# Patient Record
Sex: Male | Born: 1960 | Race: White | Hispanic: No | Marital: Married | Smoking: Never smoker
Health system: Southern US, Community
[De-identification: ages and names within clinical notes are randomized; demographics above are authoritative.]

## PROBLEM LIST (undated history)

## (undated) DIAGNOSIS — I509 Heart failure, unspecified: Secondary | ICD-10-CM

## (undated) HISTORY — PX: PACEMAKER INSERTION: SHX728

---

## 2011-12-15 DIAGNOSIS — E7800 Pure hypercholesterolemia, unspecified: Secondary | ICD-10-CM | POA: Insufficient documentation

## 2013-05-05 DIAGNOSIS — I429 Cardiomyopathy, unspecified: Secondary | ICD-10-CM | POA: Insufficient documentation

## 2013-05-05 DIAGNOSIS — Q231 Congenital insufficiency of aortic valve: Secondary | ICD-10-CM | POA: Insufficient documentation

## 2013-05-05 DIAGNOSIS — I442 Atrioventricular block, complete: Secondary | ICD-10-CM | POA: Insufficient documentation

## 2013-05-27 DIAGNOSIS — K4090 Unilateral inguinal hernia, without obstruction or gangrene, not specified as recurrent: Secondary | ICD-10-CM | POA: Insufficient documentation

## 2013-05-27 DIAGNOSIS — A422 Cervicofacial actinomycosis: Secondary | ICD-10-CM | POA: Insufficient documentation

## 2013-07-10 DIAGNOSIS — I341 Nonrheumatic mitral (valve) prolapse: Secondary | ICD-10-CM | POA: Insufficient documentation

## 2013-07-10 DIAGNOSIS — I517 Cardiomegaly: Secondary | ICD-10-CM | POA: Insufficient documentation

## 2018-12-13 DIAGNOSIS — Z952 Presence of prosthetic heart valve: Secondary | ICD-10-CM | POA: Insufficient documentation

## 2018-12-21 DIAGNOSIS — I5020 Unspecified systolic (congestive) heart failure: Secondary | ICD-10-CM | POA: Insufficient documentation

## 2019-02-26 ENCOUNTER — Other Ambulatory Visit: Payer: Self-pay

## 2019-02-26 ENCOUNTER — Encounter: Payer: PRIVATE HEALTH INSURANCE | Attending: Internal Medicine | Admitting: *Deleted

## 2019-02-26 DIAGNOSIS — Z952 Presence of prosthetic heart valve: Secondary | ICD-10-CM

## 2019-02-26 NOTE — Progress Notes (Signed)
Virtual initial orientation completed. Diagnosis can be found in CE 8/26. EP/RD orientation scheduled for 9/10 at 9:30

## 2019-02-27 ENCOUNTER — Encounter: Payer: PRIVATE HEALTH INSURANCE | Admitting: *Deleted

## 2019-02-27 VITALS — Ht 71.2 in | Wt 202.1 lb

## 2019-02-27 DIAGNOSIS — Z952 Presence of prosthetic heart valve: Secondary | ICD-10-CM | POA: Diagnosis present

## 2019-02-27 NOTE — Progress Notes (Signed)
Cardiac Individual Treatment Plan  Patient Details  Name: Brandon Kennedy MRN: [id number] Date of Birth: 01-27-1961 Referring Provider:     Cardiac Rehab from 02/27/2019 in [facility] Cardiac and Pulmonary Rehab  Referring Provider  Marisa Severin MD      Initial Encounter Date:    Cardiac Rehab from 02/27/2019 in [facility] Cardiac and Pulmonary Rehab  Date  02/27/19      Visit Diagnosis: S/P TAVR (transcatheter aortic valve replacement)  Patient's Home Medications on Admission:  Current Outpatient Medications:  .  acetaminophen (TYLENOL) 500 MG tablet, Take by mouth., Disp: , Rfl:  .  aspirin EC 81 MG tablet, Take by mouth., Disp: , Rfl:  .  losartan (COZAAR) 25 MG tablet, Take by mouth., Disp: , Rfl:  .  metoprolol succinate (TOPROL-XL) 25 MG 24 hr tablet, Take by mouth., Disp: , Rfl:   Past Medical History: No past medical history on file.  Tobacco Use: Social History   Tobacco Use  Smoking Status Never Smoker  Smokeless Tobacco Never Used    Labs: Recent Review Flowsheet Data    There is no flowsheet data to display.       Exercise Target Goals: Exercise Program Goal: Individual exercise prescription set using results from initial 6 min walk test and THRR while considering  patient's activity barriers and safety.   Exercise Prescription Goal: Initial exercise prescription builds to 30-45 minutes a day of aerobic activity, 2-3 days per week.  Home exercise guidelines will be given to patient during program as part of exercise prescription that the participant will acknowledge.  Activity Barriers & Risk Stratification: Activity Barriers & Cardiac Risk Stratification - 02/27/19 1054      Activity Barriers & Cardiac Risk Stratification   Activity Barriers  Arthritis;Joint Problems;Decreased Ventricular Function;Muscular Weakness   bilateral knee pain   Cardiac Risk Stratification  Low       6 Minute Walk: 6 Minute Walk    Row Name 02/27/19 1052         6 Minute  Walk   Phase  Initial     Distance  1806 feet     Walk Time  6 minutes     # of Rest Breaks  0     MPH  3.42     METS  5.13     RPE  14     VO2 Peak  17.97     Symptoms  No     Resting HR  85 bpm     Resting BP  142/86     Resting Oxygen Saturation   98 %     Exercise Oxygen Saturation  during 6 min walk  99 %     Max Ex. HR  128 bpm     Max Ex. BP  184/74 recheck BP prior to resistance training 164/72     2 Minute Post BP  142/74        Oxygen Initial Assessment:   Oxygen Re-Evaluation:   Oxygen Discharge (Final Oxygen Re-Evaluation):   Initial Exercise Prescription: Initial Exercise Prescription - 02/27/19 1000      Date of Initial Exercise RX and Referring Provider   Date  02/27/19    Referring Provider  Marisa Severin MD      Treadmill   MPH  3.3    Grade  1    Minutes  15    METs  3.71      REL-XR   Level  3  Speed  50    Minutes  15    METs  3.5      T5 Nustep   Level  4    SPM  80    Minutes  15    METs  3.5      Prescription Details   Frequency (times per week)  2    Duration  Progress to 30 minutes of continuous aerobic without signs/symptoms of physical distress      Intensity   THRR 40-80% of Max Heartrate  115-145    Ratings of Perceived Exertion  11-13    Perceived Dyspnea  0-4      Progression   Progression  Continue to progress workloads to maintain intensity without signs/symptoms of physical distress.      Resistance Training   Training Prescription  Yes    Weight  4 lbs    Reps  10-15       Perform Capillary Blood Glucose checks as needed.  Exercise Prescription Changes: Exercise Prescription Changes    Row Name 02/27/19 1100             Response to Exercise   Blood Pressure (Admit)  142/86       Blood Pressure (Exercise)  184/74 rck 164/72       Blood Pressure (Exit)  142/74       Heart Rate (Admit)  85 bpm       Heart Rate (Exercise)  128 bpm       Heart Rate (Exit)  92 bpm       Oxygen Saturation (Admit)   98 %       Oxygen Saturation (Exercise)  99 %       Rating of Perceived Exertion (Exercise)  14       Symptoms  none       Comments  walk test results          Exercise Comments:   Exercise Goals and Review: Exercise Goals    Row Name 02/27/19 1109             Exercise Goals   Increase Physical Activity  Yes       Intervention  Provide advice, education, support and counseling about physical activity/exercise needs.;Develop an individualized exercise prescription for aerobic and resistive training based on initial evaluation findings, risk stratification, comorbidities and participant's personal goals.       Expected Outcomes  Short Term: Attend rehab on a regular basis to increase amount of physical activity.;Long Term: Add in home exercise to make exercise part of routine and to increase amount of physical activity.;Long Term: Exercising regularly at least 3-5 days a week.       Increase Strength and Stamina  Yes       Intervention  Provide advice, education, support and counseling about physical activity/exercise needs.;Develop an individualized exercise prescription for aerobic and resistive training based on initial evaluation findings, risk stratification, comorbidities and participant's personal goals.       Expected Outcomes  Short Term: Increase workloads from initial exercise prescription for resistance, speed, and METs.;Short Term: Perform resistance training exercises routinely during rehab and add in resistance training at home;Long Term: Improve cardiorespiratory fitness, muscular endurance and strength as measured by increased METs and functional capacity (6MWT)       Able to understand and use rate of perceived exertion (RPE) scale  Yes       Intervention  Provide education and explanation on how to use RPE  scale       Expected Outcomes  Short Term: Able to use RPE daily in rehab to express subjective intensity level;Long Term:  Able to use RPE to guide intensity level  when exercising independently       Knowledge and understanding of Target Heart Rate Range (THRR)  Yes       Intervention  Provide education and explanation of THRR including how the numbers were predicted and where they are located for reference       Expected Outcomes  Short Term: Able to state/look up THRR;Short Term: Able to use daily as guideline for intensity in rehab;Long Term: Able to use THRR to govern intensity when exercising independently       Able to check pulse independently  Yes       Intervention  Provide education and demonstration on how to check pulse in carotid and radial arteries.;Review the importance of being able to check your own pulse for safety during independent exercise       Expected Outcomes  Short Term: Able to explain why pulse checking is important during independent exercise;Long Term: Able to check pulse independently and accurately       Understanding of Exercise Prescription  Yes       Intervention  Provide education, explanation, and written materials on patient's individual exercise prescription       Expected Outcomes  Long Term: Able to explain home exercise prescription to exercise independently;Short Term: Able to explain program exercise prescription          Exercise Goals Re-Evaluation :   Discharge Exercise Prescription (Final Exercise Prescription Changes): Exercise Prescription Changes - 02/27/19 1100      Response to Exercise   Blood Pressure (Admit)  142/86    Blood Pressure (Exercise)  184/74   rck 164/72   Blood Pressure (Exit)  142/74    Heart Rate (Admit)  85 bpm    Heart Rate (Exercise)  128 bpm    Heart Rate (Exit)  92 bpm    Oxygen Saturation (Admit)  98 %    Oxygen Saturation (Exercise)  99 %    Rating of Perceived Exertion (Exercise)  14    Symptoms  none    Comments  walk test results       Nutrition:  Target Goals: Understanding of nutrition guidelines, daily intake of sodium '1500mg'$ , cholesterol '200mg'$ , calories 30%  from fat and 7% or less from saturated fats, daily to have 5 or more servings of fruits and vegetables.  Biometrics: Pre Biometrics - 02/27/19 1112      Pre Biometrics   Height  5' 11.2" (1.808 m)    Weight  202 lb 1.6 oz (91.7 kg)    BMI (Calculated)  28.04    Single Leg Stand  30 seconds        Nutrition Therapy Plan and Nutrition Goals: Nutrition Therapy & Goals - 02/27/19 1048      Nutrition Therapy   Diet  low Na, HH diet    Protein (specify units)  95g    Fiber  30 grams    Whole Grain Foods  3 servings    Saturated Fats  12 max. grams    Fruits and Vegetables  5 servings/day    Sodium  1.5 grams      Personal Nutrition Goals   Nutrition Goal  ST: take note of the ranges of Na you have daily  LT: have and improved EF    Comments  Pt and wife used a lot of diet culture buzz words "clean eating", "real food", "good food", "bad food", "chemicals". This RD discussed some myths and facts about food and food balance. Pt reports eating raisin bran with whole milk, either leftovers, whole grain sandwich with [place], or microwave meal (wife reports its a healthy one from the co-op), snacks are usually dry roasted/unsalted nuts or fruit, dinner is either chicken (red meat 2x/week) or plant protein like bean salad, whole grains, and vegetables. Pt and wife report eating mostly fresh foods and food from their garden. They choose healthy fats like olive oil and will use butter (but not as much). on weekends pt will have an egg with an english muffin. Pt wife reports cooking almost all the food from scratch and using salt which she has tried to cut back. Disucssed HH eating, low sodium eating, and fiber. Pt reports he likes what he is eating.      Intervention Plan   Intervention  Prescribe, educate and counsel regarding individualized specific dietary modifications aiming towards targeted core components such as weight, hypertension, lipid management, diabetes, heart failure and other  comorbidities.;Nutrition handout(s) given to patient.    Expected Outcomes  Short Term Goal: Understand basic principles of dietary content, such as calories, fat, sodium, cholesterol and nutrients.;Short Term Goal: A plan has been developed with personal nutrition goals set during dietitian appointment.;Long Term Goal: Adherence to prescribed nutrition plan.       Nutrition Assessments: Nutrition Assessments - 02/27/19 1048      MEDFICTS Scores   Pre Score  32       Nutrition Goals Re-Evaluation:   Nutrition Goals Discharge (Final Nutrition Goals Re-Evaluation):   Psychosocial: Target Goals: Acknowledge presence or absence of significant depression and/or stress, maximize coping skills, provide positive support system. Participant is able to verbalize types and ability to use techniques and skills needed for reducing stress and depression.   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 He is glad to be rid of his LifeVest, but wanting to make sure his heart is okay. He and his wife are making lifestyle changes and he wants to get more education regarding his health's future.      Family Dynamics   Good Support System?  Yes      Barriers   Psychosocial barriers to participate in program  There are no identifiable barriers or psychosocial needs.;The patient should benefit from training in stress management and relaxation.      Screening Interventions   Interventions  Encouraged to exercise;To provide support and resources with identified psychosocial needs;Provide feedback about the scores to participant    Expected Outcomes  Short Term goal: Utilizing psychosocial counselor, staff and physician to assist with identification of specific Stressors or current issues interfering with  healing process. Setting desired goal for each stressor or current issue identified.;Long Term Goal: Stressors or current issues are controlled or eliminated.;Short Term goal: Identification and review with participant of any Quality of Life or Depression concerns found by scoring the questionnaire.;Long Term goal: The participant improves quality of Life and PHQ9 Scores as seen by post scores and/or verbalization of changes       Quality of Life Scores:  Quality of Life - 02/27/19  1115      Quality of Life   Select  Quality of Life      Quality of Life Scores   Health/Function Pre  27.83 %    Socioeconomic Pre  28.07 %    Psych/Spiritual Pre  28.29 %    Family Pre  30 %    GLOBAL Pre  28.29 %      Scores of 19 and below usually indicate a poorer quality of life in these areas.  A difference of  2-3 points is a clinically meaningful difference.  A difference of 2-3 points in the total score of the Quality of Life Index has been associated with significant improvement in overall quality of life, self-image, physical symptoms, and general health in studies assessing change in quality of life.  PHQ-9: Recent Review Flowsheet Data    Depression screen [facility] 2/9 02/27/2019   Decreased Interest 0   Down, Depressed, Hopeless 0   PHQ - 2 Score 0   Altered sleeping 1   Tired, decreased energy 1   Change in appetite 0   Feeling bad or failure about yourself  0   Trouble concentrating 0   Moving slowly or fidgety/restless 0   Suicidal thoughts 0   PHQ-9 Score 2   Difficult doing work/chores Not difficult at all     Interpretation of Total Score  Total Score Depression Severity:  1-4 = Minimal depression, 5-9 = Mild depression, 10-14 = Moderate depression, 15-19 = Moderately severe depression, 20-27 = Severe depression   Psychosocial Evaluation and Intervention:   Psychosocial Re-Evaluation:   Psychosocial Discharge (Final Psychosocial Re-Evaluation):   Vocational  Rehabilitation: Provide vocational rehab assistance to qualifying candidates.   Vocational Rehab Evaluation & Intervention: Vocational Rehab - 02/26/19 1039      Initial Vocational Rehab Evaluation & Intervention   Assessment shows need for Vocational Rehabilitation  No       Education: Education Goals: Education classes will be provided on a variety of topics geared toward better understanding of heart health and risk factor modification. Participant will state understanding/return demonstration of topics presented as noted by education test scores.  Learning Barriers/Preferences: Learning Barriers/Preferences - 02/26/19 1039      Learning Barriers/Preferences   Learning Barriers  None    Learning Preferences  None       Education Topics:  AED/CPR: - Group verbal and written instruction with the use of models to demonstrate the basic use of the AED with the basic ABC's of resuscitation.   General Nutrition Guidelines/Fats and Fiber: -Group instruction provided by verbal, written material, models and posters to present the general guidelines for heart healthy nutrition. Gives an explanation and review of dietary fats and fiber.   Controlling Sodium/Reading Food Labels: -Group verbal and written material supporting the discussion of sodium use in heart healthy nutrition. Review and explanation with models, verbal and written materials for utilization of the food label.   Exercise Physiology & General Exercise Guidelines: - Group verbal and written instruction with models to review the exercise physiology of the cardiovascular system and associated critical values. Provides general exercise guidelines with specific guidelines to those with heart or lung disease.    Aerobic Exercise & Resistance Training: - Gives group verbal and written instruction on the various components of exercise. Focuses on aerobic and resistive training programs and the benefits of this training and  how to safely progress through these programs..   Flexibility, Balance, Mind/Body Relaxation: Provides group verbal/written instruction on  the benefits of flexibility and balance training, including mind/body exercise modes such as yoga, pilates and tai chi.  Demonstration and skill practice provided.   Stress and Anxiety: - Provides group verbal and written instruction about the health risks of elevated stress and causes of high stress.  Discuss the correlation between heart/lung disease and anxiety and treatment options. Review healthy ways to manage with stress and anxiety.   Depression: - Provides group verbal and written instruction on the correlation between heart/lung disease and depressed mood, treatment options, and the stigmas associated with seeking treatment.   Anatomy & Physiology of the Heart: - Group verbal and written instruction and models provide basic cardiac anatomy and physiology, with the coronary electrical and arterial systems. Review of Valvular disease and Heart Failure   Cardiac Procedures: - Group verbal and written instruction to review commonly prescribed medications for heart disease. Reviews the medication, class of the drug, and side effects. Includes the steps to properly store meds and maintain the prescription regimen. (beta blockers and nitrates)   Cardiac Medications I: - Group verbal and written instruction to review commonly prescribed medications for heart disease. Reviews the medication, class of the drug, and side effects. Includes the steps to properly store meds and maintain the prescription regimen.   Cardiac Medications II: -Group verbal and written instruction to review commonly prescribed medications for heart disease. Reviews the medication, class of the drug, and side effects. (all other drug classes)    Go Sex-Intimacy & Heart Disease, Get SMART - Goal Setting: - Group verbal and written instruction through game format to discuss  heart disease and the return to sexual intimacy. Provides group verbal and written material to discuss and apply goal setting through the application of the S.M.A.R.T. Method.   Other Matters of the Heart: - Provides group verbal, written materials and models to describe Stable Angina and Peripheral Artery. Includes description of the disease process and treatment options available to the cardiac patient.   Exercise & Equipment Safety: - Individual verbal instruction and demonstration of equipment use and safety with use of the equipment.   Cardiac Rehab from 02/27/2019 in [facility] Cardiac and Pulmonary Rehab  Date  02/27/19  Educator  [facility]  Instruction Review Code  1- Verbalizes Understanding      Infection Prevention: - Provides verbal and written material to individual with discussion of infection control including proper hand washing and proper equipment cleaning during exercise session.   Cardiac Rehab from 02/27/2019 in [facility] Cardiac and Pulmonary Rehab  Date  02/27/19  Educator  [facility]  Instruction Review Code  1- Verbalizes Understanding      Falls Prevention: - Provides verbal and written material to individual with discussion of falls prevention and safety.   Cardiac Rehab from 02/27/2019 in [facility] Cardiac and Pulmonary Rehab  Date  02/27/19  Educator  [facility]  Instruction Review Code  1- Verbalizes Understanding      Diabetes: - Individual verbal and written instruction to review signs/symptoms of diabetes, desired ranges of glucose level fasting, after meals and with exercise. Acknowledge that pre and post exercise glucose checks will be done for 3 sessions at entry of program.   Know Your Numbers and Risk Factors: -Group verbal and written instruction about important numbers in your health.  Discussion of what are risk factors and how they play a role in the disease process.  Review of Cholesterol, Blood Pressure, Diabetes, and BMI and the role they play in your overall  health.  Sleep Hygiene: -Provides group verbal and written instruction about how sleep can affect your health.  Define sleep hygiene, discuss sleep cycles and impact of sleep habits. Review good sleep hygiene tips.    Other: -Provides group and verbal instruction on various topics (see comments)   Knowledge Questionnaire Score:   Core Components/Risk Factors/Patient Goals at Admission: Personal Goals and Risk Factors at Admission - 02/27/19 1112      Core Components/Risk Factors/Patient Goals on Admission    Weight Management  Yes;Weight Loss    Intervention  Weight Management: Develop a combined nutrition and exercise program designed to reach desired caloric intake, while maintaining appropriate intake of nutrient and fiber, sodium and fats, and appropriate energy expenditure required for the weight goal.;Weight Management: Provide education and appropriate resources to help participant work on and attain dietary goals.    Admit Weight  202 lb 1.6 oz (91.7 kg)    Goal Weight: Short Term  195 lb (88.5 kg)    Goal Weight: Long Term  190 lb (86.2 kg)    Expected Outcomes  Short Term: Continue to assess and modify interventions until short term weight is achieved;Long Term: Adherence to nutrition and physical activity/exercise program aimed toward attainment of established weight goal;Weight Loss: Understanding of general recommendations for a balanced deficit meal plan, which promotes 1-2 lb weight loss per week and includes a negative energy balance of [phone] kcal/d;Understanding recommendations for meals to include 15-35% energy as protein, 25-35% energy from fat, 35-60% energy from carbohydrates, less than '200mg'$  of dietary cholesterol, 20-35 gm of total fiber daily;Understanding of distribution of calorie intake throughout the day with the consumption of 4-5 meals/snacks    Heart Failure  Yes    Intervention  Provide a combined exercise and nutrition program that is supplemented with  education, support and counseling about heart failure. Directed toward relieving symptoms such as shortness of breath, decreased exercise tolerance, and extremity edema.    Expected Outcomes  Improve functional capacity of life;Short term: Attendance in program 2-3 days a week with increased exercise capacity. Reported lower sodium intake. Reported increased fruit and vegetable intake. Reports medication compliance.;Short term: Daily weights obtained and reported for increase. Utilizing diuretic protocols set by physician.;Long term: Adoption of self-care skills and reduction of barriers for early signs and symptoms recognition and intervention leading to self-care maintenance.    Lipids  Yes    Intervention  Provide education and support for participant on nutrition & aerobic/resistive exercise along with prescribed medications to achieve LDL '70mg'$ , HDL >'40mg'$ .    Expected Outcomes  Short Term: Participant states understanding of desired cholesterol values and is compliant with medications prescribed. Participant is following exercise prescription and nutrition guidelines.;Long Term: Cholesterol controlled with medications as prescribed, with individualized exercise RX and with personalized nutrition plan. Value goals: LDL < '70mg'$ , HDL > 40 mg.       Core Components/Risk Factors/Patient Goals Review:    Core Components/Risk Factors/Patient Goals at Discharge (Final Review):    ITP Comments: ITP Comments    Row Name 02/26/19 1043 02/27/19 1052         ITP Comments  Virtual initial orientation completed. Diagnosis can be found in CE 8/26. EP/RD orientation scheduled for 9/10 at 9:30  Completed 6MWT, gym orientation, and RD evaluation. Initial ITP created and sent for review to Dr. Emily Filbert, Medical Director.         Comments: Initial ITP

## 2019-02-27 NOTE — Patient Instructions (Signed)
Patient Instructions  Patient Details  Name: Brandon Kennedy MRN: [id number] Date of Birth: 01/23/1961 Referring Provider:  Clyda GreenerVavalle, John P, MD  Below are your personal goals for exercise, nutrition, and risk factors. Our goal is to help you stay on track towards obtaining and maintaining these goals. We will be discussing your progress on these goals with you throughout the program.  Initial Exercise Prescription: Initial Exercise Prescription - 02/27/19 1000      Date of Initial Exercise RX and Referring Provider   Date  02/27/19    Referring Provider  Elyn PeersVavalle, John MD      Treadmill   MPH  3.3    Grade  1    Minutes  15    METs  3.71      REL-XR   Level  3    Speed  50    Minutes  15    METs  3.5      T5 Nustep   Level  4    SPM  80    Minutes  15    METs  3.5      Prescription Details   Frequency (times per week)  2    Duration  Progress to 30 minutes of continuous aerobic without signs/symptoms of physical distress      Intensity   THRR 40-80% of Max Heartrate  115-145    Ratings of Perceived Exertion  11-13    Perceived Dyspnea  0-4      Progression   Progression  Continue to progress workloads to maintain intensity without signs/symptoms of physical distress.      Resistance Training   Training Prescription  Yes    Weight  4 lbs    Reps  10-15       Exercise Goals: Frequency: Be able to perform aerobic exercise two to three times per week in program working toward 2-5 days per week of home exercise.  Intensity: Work with a perceived exertion of 11 (fairly light) - 15 (hard) while following your exercise prescription.  We will make changes to your prescription with you as you progress through the program.   Duration: Be able to do 30 to 45 minutes of continuous aerobic exercise in addition to a 5 minute warm-up and a 5 minute cool-down routine.   Nutrition Goals: Your personal nutrition goals will be established when you do your nutrition analysis  with the dietician.  The following are general nutrition guidelines to follow: Cholesterol < 200mg /day Sodium < 1500mg /day Fiber: Men over 50 yrs - 30 grams per day  Personal Goals: Personal Goals and Risk Factors at Admission - 02/27/19 1112      Core Components/Risk Factors/Patient Goals on Admission    Weight Management  Yes;Weight Loss    Intervention  Weight Management: Develop a combined nutrition and exercise program designed to reach desired caloric intake, while maintaining appropriate intake of nutrient and fiber, sodium and fats, and appropriate energy expenditure required for the weight goal.;Weight Management: Provide education and appropriate resources to help participant work on and attain dietary goals.    Admit Weight  202 lb 1.6 oz (91.7 kg)    Goal Weight: Short Term  195 lb (88.5 kg)    Goal Weight: Long Term  190 lb (86.2 kg)    Expected Outcomes  Short Term: Continue to assess and modify interventions until short term weight is achieved;Long Term: Adherence to nutrition and physical activity/exercise program aimed toward attainment of established weight goal;Weight Loss:  Understanding of general recommendations for a balanced deficit meal plan, which promotes 1-2 lb weight loss per week and includes a negative energy balance of [phone] kcal/d;Understanding recommendations for meals to include 15-35% energy as protein, 25-35% energy from fat, 35-60% energy from carbohydrates, less than 200mg  of dietary cholesterol, 20-35 gm of total fiber daily;Understanding of distribution of calorie intake throughout the day with the consumption of 4-5 meals/snacks    Heart Failure  Yes    Intervention  Provide a combined exercise and nutrition program that is supplemented with education, support and counseling about heart failure. Directed toward relieving symptoms such as shortness of breath, decreased exercise tolerance, and extremity edema.    Expected Outcomes  Improve functional  capacity of life;Short term: Attendance in program 2-3 days a week with increased exercise capacity. Reported lower sodium intake. Reported increased fruit and vegetable intake. Reports medication compliance.;Short term: Daily weights obtained and reported for increase. Utilizing diuretic protocols set by physician.;Long term: Adoption of self-care skills and reduction of barriers for early signs and symptoms recognition and intervention leading to self-care maintenance.    Lipids  Yes    Intervention  Provide education and support for participant on nutrition & aerobic/resistive exercise along with prescribed medications to achieve LDL 70mg , HDL >40mg .    Expected Outcomes  Short Term: Participant states understanding of desired cholesterol values and is compliant with medications prescribed. Participant is following exercise prescription and nutrition guidelines.;Long Term: Cholesterol controlled with medications as prescribed, with individualized exercise RX and with personalized nutrition plan. Value goals: LDL < 70mg , HDL > 40 mg.       Tobacco Use Initial Evaluation: Social History   Tobacco Use  Smoking Status Never Smoker  Smokeless Tobacco Never Used    Exercise Goals and Review: Exercise Goals    Row Name 02/27/19 1109             Exercise Goals   Increase Physical Activity  Yes       Intervention  Provide advice, education, support and counseling about physical activity/exercise needs.;Develop an individualized exercise prescription for aerobic and resistive training based on initial evaluation findings, risk stratification, comorbidities and participant's personal goals.       Expected Outcomes  Short Term: Attend rehab on a regular basis to increase amount of physical activity.;Long Term: Add in home exercise to make exercise part of routine and to increase amount of physical activity.;Long Term: Exercising regularly at least 3-5 days a week.       Increase Strength and  Stamina  Yes       Intervention  Provide advice, education, support and counseling about physical activity/exercise needs.;Develop an individualized exercise prescription for aerobic and resistive training based on initial evaluation findings, risk stratification, comorbidities and participant's personal goals.       Expected Outcomes  Short Term: Increase workloads from initial exercise prescription for resistance, speed, and METs.;Short Term: Perform resistance training exercises routinely during rehab and add in resistance training at home;Long Term: Improve cardiorespiratory fitness, muscular endurance and strength as measured by increased METs and functional capacity (6MWT)       Able to understand and use rate of perceived exertion (RPE) scale  Yes       Intervention  Provide education and explanation on how to use RPE scale       Expected Outcomes  Short Term: Able to use RPE daily in rehab to express subjective intensity level;Long Term:  Able to use RPE to guide intensity  level when exercising independently       Knowledge and understanding of Target Heart Rate Range (THRR)  Yes       Intervention  Provide education and explanation of THRR including how the numbers were predicted and where they are located for reference       Expected Outcomes  Short Term: Able to state/look up THRR;Short Term: Able to use daily as guideline for intensity in rehab;Long Term: Able to use THRR to govern intensity when exercising independently       Able to check pulse independently  Yes       Intervention  Provide education and demonstration on how to check pulse in carotid and radial arteries.;Review the importance of being able to check your own pulse for safety during independent exercise       Expected Outcomes  Short Term: Able to explain why pulse checking is important during independent exercise;Long Term: Able to check pulse independently and accurately       Understanding of Exercise Prescription  Yes        Intervention  Provide education, explanation, and written materials on patient's individual exercise prescription       Expected Outcomes  Long Term: Able to explain home exercise prescription to exercise independently;Short Term: Able to explain program exercise prescription          Copy of goals given to participant.

## 2019-03-04 ENCOUNTER — Other Ambulatory Visit: Payer: Self-pay

## 2019-03-04 ENCOUNTER — Encounter: Payer: PRIVATE HEALTH INSURANCE | Admitting: *Deleted

## 2019-03-04 DIAGNOSIS — Z952 Presence of prosthetic heart valve: Secondary | ICD-10-CM | POA: Diagnosis not present

## 2019-03-04 NOTE — Progress Notes (Signed)
Daily Session Note  Patient Details  Name: Brandon Kennedy MRN: [id number] Date of Birth: 01-03-1961 Referring Provider:     Cardiac Rehab from 02/27/2019 in [facility] Cardiac and Pulmonary Rehab  Referring Provider  Marisa Severin MD      Encounter Date: 03/04/2019  Check In: Session Check In - 03/04/19 0759      Check-In   Supervising physician immediately available to respond to emergencies  See telemetry face sheet for immediately available ER MD    Location  [facility]-Cardiac & Pulmonary Rehab    Staff Present  Heath Lark, RN, BSN, CCRP;Joseph Foy [place], [place], ACSM CEP, Exercise Physiologist    Virtual Visit  No    Medication changes reported      No    Fall or balance concerns reported     No    Warm-up and Cool-down  Performed on first and last piece of equipment    Resistance Training Performed  Yes    VAD Patient?  No    PAD/SET Patient?  No      Pain Assessment   Currently in Pain?  No/denies          Social History   Tobacco Use  Smoking Status Never Smoker  Smokeless Tobacco Never Used    Goals Met:  Exercise tolerated well Personal goals reviewed No report of cardiac concerns or symptoms  Goals Unmet:  Not Applicable  Comments: First full day of exercise!  Patient was oriented to gym and equipment including functions, settings, policies, and procedures.  Patient's individual exercise prescription and treatment plan were reviewed.  All starting workloads were established based on the results of the 6 minute walk test done at initial orientation visit.  The plan for exercise progression was also introduced and progression will be customized based on patient's performance and goals.    Dr. Emily Filbert is Medical Director for [place] and [facility].

## 2019-03-06 ENCOUNTER — Other Ambulatory Visit: Payer: Self-pay

## 2019-03-06 DIAGNOSIS — Z952 Presence of prosthetic heart valve: Secondary | ICD-10-CM | POA: Diagnosis not present

## 2019-03-06 NOTE — Progress Notes (Signed)
Daily Session Note  Patient Details  Name: Brandon Kennedy MRN: [id number] Date of Birth: 06/07/1961 Referring Provider:     Cardiac Rehab from 02/27/2019 in [facility] Cardiac and Pulmonary Rehab  Referring Provider  Marisa Severin MD      Encounter Date: 03/06/2019  Check In: Session Check In - 03/06/19 0742      Check-In   Supervising physician immediately available to respond to emergencies  See telemetry face sheet for immediately available ER MD    Staff Present  Vida Rigger RN, Vickki Hearing, BA, ACSM CEP, Exercise Physiologist;Jessica Luan Pulling, MA, RCEP, CCRP, CCET    Virtual Visit  No    Medication changes reported      No    Fall or balance concerns reported     No    Warm-up and Cool-down  Performed on first and last piece of equipment    Resistance Training Performed  Yes    VAD Patient?  No    PAD/SET Patient?  No      Pain Assessment   Currently in Pain?  No/denies    Multiple Pain Sites  No          Social History   Tobacco Use  Smoking Status Never Smoker  Smokeless Tobacco Never Used    Goals Met:  Exercise tolerated well No report of cardiac concerns or symptoms Strength training completed today  Goals Unmet:  Not Applicable  Comments: Pt able to follow exercise prescription today without complaint.  Will continue to monitor for progression.   Dr. Emily Filbert is Medical Director for [place] and [facility].

## 2019-03-11 ENCOUNTER — Other Ambulatory Visit: Payer: Self-pay

## 2019-03-11 ENCOUNTER — Encounter: Payer: PRIVATE HEALTH INSURANCE | Admitting: *Deleted

## 2019-03-11 DIAGNOSIS — Z952 Presence of prosthetic heart valve: Secondary | ICD-10-CM | POA: Diagnosis not present

## 2019-03-11 NOTE — Progress Notes (Signed)
Daily Session Note  Patient Details  Name: Brandon Kennedy MRN: [id number] Date of Birth: [birth year] Referring Provider:     Cardiac Rehab from 02/27/2019 in [facility] Cardiac and Pulmonary Rehab  Referring Provider  Marisa Severin MD      Encounter Date: 03/11/2019  Check In: Session Check In - 03/11/19 0735      Check-In   Supervising physician immediately available to respond to emergencies  See telemetry face sheet for immediately available ER MD    Location  [facility]-Cardiac & Pulmonary Rehab    Staff Present  Heath Lark, RN, BSN, CCRP;Joseph Foy [place], [place], ACSM CEP, Exercise Physiologist    Virtual Visit  No    Medication changes reported      No    Fall or balance concerns reported     No    Warm-up and Cool-down  Performed on first and last piece of equipment    Resistance Training Performed  Yes    VAD Patient?  No    PAD/SET Patient?  No          Social History   Tobacco Use  Smoking Status Never Smoker  Smokeless Tobacco Never Used    Goals Met:  Exercise tolerated well No report of cardiac concerns or symptoms  Goals Unmet:  Not Applicable  Comments: Pt able to follow exercise prescription today without complaint.  Will continue to monitor for progression.    Dr. Emily Filbert is Medical Director for [place] and [facility].

## 2019-03-13 ENCOUNTER — Other Ambulatory Visit: Payer: Self-pay

## 2019-03-13 DIAGNOSIS — Z952 Presence of prosthetic heart valve: Secondary | ICD-10-CM

## 2019-03-13 NOTE — Progress Notes (Signed)
Daily Session Note  Patient Details  Name: Brandon Kennedy MRN: [id number] Date of Birth: July 11, 1960 Referring Provider:     Cardiac Rehab from 02/27/2019 in [facility] Cardiac and Pulmonary Rehab  Referring Provider  Marisa Severin MD      Encounter Date: 03/13/2019  Check In:      Social History   Tobacco Use  Smoking Status Never Smoker  Smokeless Tobacco Never Used    Goals Met:  Independence with exercise equipment Exercise tolerated well Personal goals reviewed No report of cardiac concerns or symptoms Strength training completed today  Goals Unmet:  Not Applicable  Comments: Pt able to follow exercise prescription today without complaint.  Will continue to monitor for progression.    Dr. Emily Filbert is Medical Director for [place] and [facility].

## 2019-03-18 ENCOUNTER — Encounter: Payer: PRIVATE HEALTH INSURANCE | Admitting: *Deleted

## 2019-03-18 ENCOUNTER — Other Ambulatory Visit: Payer: Self-pay

## 2019-03-18 DIAGNOSIS — Z952 Presence of prosthetic heart valve: Secondary | ICD-10-CM

## 2019-03-18 NOTE — Progress Notes (Signed)
Daily Session Note  Patient Details  Name: Rigdon Macomber MRN: [id number] Date of Birth: 06-Nov-1960 Referring Provider:     Cardiac Rehab from 02/27/2019 in [facility] Cardiac and Pulmonary Rehab  Referring Provider  Marisa Severin MD      Encounter Date: 03/18/2019  Check In: Session Check In - 03/18/19 0739      Check-In   Supervising physician immediately available to respond to emergencies  See telemetry face sheet for immediately available ER MD    Location  [facility]-Cardiac & Pulmonary Rehab    Staff Present  Darel Hong, RN Vickki Hearing, BA, ACSM CEP, Exercise Physiologist;Joseph Tessie Fass RCP,RRT,BSRT    Virtual Visit  No    Medication changes reported      No    Fall or balance concerns reported     No    Tobacco Cessation  No Change    Warm-up and Cool-down  Performed on first and last piece of equipment    Resistance Training Performed  Yes    VAD Patient?  No    PAD/SET Patient?  No      Pain Assessment   Currently in Pain?  No/denies          Social History   Tobacco Use  Smoking Status Never Smoker  Smokeless Tobacco Never Used    Goals Met:  Independence with exercise equipment Achieving weight loss Exercise tolerated well No report of cardiac concerns or symptoms Strength training completed today  Goals Unmet:  Not Applicable  Comments: Pt able to follow exercise prescription today without complaint.  Will continue to monitor for progression.    Dr. Emily Filbert is Medical Director for [place] and [facility].

## 2019-03-20 ENCOUNTER — Encounter: Payer: PRIVATE HEALTH INSURANCE | Attending: Internal Medicine | Admitting: *Deleted

## 2019-03-20 ENCOUNTER — Other Ambulatory Visit: Payer: Self-pay

## 2019-03-20 DIAGNOSIS — Z952 Presence of prosthetic heart valve: Secondary | ICD-10-CM | POA: Insufficient documentation

## 2019-03-20 NOTE — Progress Notes (Signed)
Daily Session Note  Patient Details  Name: Brandon Kennedy MRN: [id number] Date of Birth: 1961/01/19 Referring Provider:     Cardiac Rehab from 02/27/2019 in [facility] Cardiac and Pulmonary Rehab  Referring Provider  Marisa Severin MD      Encounter Date: 03/20/2019  Check In: Session Check In - 03/20/19 0729      Check-In   Supervising physician immediately available to respond to emergencies  See telemetry face sheet for immediately available ER MD    Location  [facility]-Cardiac & Pulmonary Rehab    Staff Present  Renita Papa, RN BSN;Jessica Luan Pulling, MA, RCEP, CCRP, CCET    Virtual Visit  No    Medication changes reported      No    Fall or balance concerns reported     No    Warm-up and Cool-down  Performed on first and last piece of equipment    Resistance Training Performed  Yes    VAD Patient?  No    PAD/SET Patient?  No      Pain Assessment   Currently in Pain?  No/denies          Social History   Tobacco Use  Smoking Status Never Smoker  Smokeless Tobacco Never Used    Goals Met:  Independence with exercise equipment Exercise tolerated well No report of cardiac concerns or symptoms Strength training completed today  Goals Unmet:  Not Applicable  Comments: Pt able to follow exercise prescription today without complaint.  Will continue to monitor for progression.    Dr. Emily Filbert is Medical Director for [place] and [facility].

## 2019-03-26 ENCOUNTER — Encounter: Payer: Self-pay | Admitting: *Deleted

## 2019-03-26 DIAGNOSIS — Z952 Presence of prosthetic heart valve: Secondary | ICD-10-CM

## 2019-03-26 NOTE — Progress Notes (Signed)
Cardiac Individual Treatment Plan  Patient Details  Name: Brandon Kennedy MRN: [id number] Date of Birth: 01-27-1961 Referring Provider:     Cardiac Rehab from 02/27/2019 in [facility] Cardiac and Pulmonary Rehab  Referring Provider  Marisa Severin MD      Initial Encounter Date:    Cardiac Rehab from 02/27/2019 in [facility] Cardiac and Pulmonary Rehab  Date  02/27/19      Visit Diagnosis: S/P TAVR (transcatheter aortic valve replacement)  Patient's Home Medications on Admission:  Current Outpatient Medications:  .  acetaminophen (TYLENOL) 500 MG tablet, Take by mouth., Disp: , Rfl:  .  aspirin EC 81 MG tablet, Take by mouth., Disp: , Rfl:  .  losartan (COZAAR) 25 MG tablet, Take by mouth., Disp: , Rfl:  .  metoprolol succinate (TOPROL-XL) 25 MG 24 hr tablet, Take by mouth., Disp: , Rfl:   Past Medical History: No past medical history on file.  Tobacco Use: Social History   Tobacco Use  Smoking Status Never Smoker  Smokeless Tobacco Never Used    Labs: Recent Review Flowsheet Data    There is no flowsheet data to display.       Exercise Target Goals: Exercise Program Goal: Individual exercise prescription set using results from initial 6 min walk test and THRR while considering  patient's activity barriers and safety.   Exercise Prescription Goal: Initial exercise prescription builds to 30-45 minutes a day of aerobic activity, 2-3 days per week.  Home exercise guidelines will be given to patient during program as part of exercise prescription that the participant will acknowledge.  Activity Barriers & Risk Stratification: Activity Barriers & Cardiac Risk Stratification - 02/27/19 1054      Activity Barriers & Cardiac Risk Stratification   Activity Barriers  Arthritis;Joint Problems;Decreased Ventricular Function;Muscular Weakness   bilateral knee pain   Cardiac Risk Stratification  Low       6 Minute Walk: 6 Minute Walk    Row Name 02/27/19 1052         6 Minute  Walk   Phase  Initial     Distance  1806 feet     Walk Time  6 minutes     # of Rest Breaks  0     MPH  3.42     METS  5.13     RPE  14     VO2 Peak  17.97     Symptoms  No     Resting HR  85 bpm     Resting BP  142/86     Resting Oxygen Saturation   98 %     Exercise Oxygen Saturation  during 6 min walk  99 %     Max Ex. HR  128 bpm     Max Ex. BP  184/74 recheck BP prior to resistance training 164/72     2 Minute Post BP  142/74        Oxygen Initial Assessment:   Oxygen Re-Evaluation:   Oxygen Discharge (Final Oxygen Re-Evaluation):   Initial Exercise Prescription: Initial Exercise Prescription - 02/27/19 1000      Date of Initial Exercise RX and Referring Provider   Date  02/27/19    Referring Provider  Marisa Severin MD      Treadmill   MPH  3.3    Grade  1    Minutes  15    METs  3.71      REL-XR   Level  3  Speed  50    Minutes  15    METs  3.5      T5 Nustep   Level  4    SPM  80    Minutes  15    METs  3.5      Prescription Details   Frequency (times per week)  2    Duration  Progress to 30 minutes of continuous aerobic without signs/symptoms of physical distress      Intensity   THRR 40-80% of Max Heartrate  115-145    Ratings of Perceived Exertion  11-13    Perceived Dyspnea  0-4      Progression   Progression  Continue to progress workloads to maintain intensity without signs/symptoms of physical distress.      Resistance Training   Training Prescription  Yes    Weight  4 lbs    Reps  10-15       Perform Capillary Blood Glucose checks as needed.  Exercise Prescription Changes: Exercise Prescription Changes    Row Name 02/27/19 1100 03/07/19 0700 03/11/19 1400 03/19/19 1400       Response to Exercise   Blood Pressure (Admit)  142/86  128/64  -  94/60    Blood Pressure (Exercise)  184/74 rck 164/72  146/62  -  140/78    Blood Pressure (Exit)  142/74  124/70  -  94/56    Heart Rate (Admit)  85 bpm  68 bpm  -  68 bpm     Heart Rate (Exercise)  128 bpm  127 bpm  -  107 bpm    Heart Rate (Exit)  92 bpm  -  -  71 bpm    Oxygen Saturation (Admit)  98 %  -  -  -    Oxygen Saturation (Exercise)  99 %  -  -  -    Rating of Perceived Exertion (Exercise)  14  13  -  13    Symptoms  none  none  -  none    Comments  walk test results  -  -  -    Duration  -  Continue with 30 min of aerobic exercise without signs/symptoms of physical distress.  -  Continue with 30 min of aerobic exercise without signs/symptoms of physical distress.    Intensity  -  THRR unchanged  -  THRR unchanged      Progression   Progression  -  -  -  Continue to progress workloads to maintain intensity without signs/symptoms of physical distress.    Average METs  -  -  -  3.69      Resistance Training   Training Prescription  -  Yes  -  Yes    Weight  -  4 lb  -  5 lbs    Reps  -  10-15  -  10-15      Interval Training   Interval Training  -  No  -  -      Treadmill   MPH  -  3  -  3.3    Grade  -  1  -  1    Minutes  -  15  -  15    METs  -  3.71  -  3.98      REL-XR   Level  -  3  -  8    Speed  -  50  -  -  Minutes  -  15  -  15    METs  -  2.8  -  4.8      T5 Nustep   Level  -  4  -  5    SPM  -  80  -  -    Minutes  -  15  -  15    METs  -  2.7  -  2.3      Home Exercise Plan   Plans to continue exercise at  -  -  Home (comment)  Home (comment)    Frequency  -  -  Add 3 additional days to program exercise sessions.  Add 3 additional days to program exercise sessions.    Initial Home Exercises Provided  -  -  03/11/19  03/11/19       Exercise Comments: Exercise Comments    Row Name 03/04/19 0800           Exercise Comments  First full day of exercise!  Patient was oriented to gym and equipment including functions, settings, policies, and procedures.  Patient's individual exercise prescription and treatment plan were reviewed.  All starting workloads were established based on the results of the 6 minute walk test  done at initial orientation visit.  The plan for exercise progression was also introduced and progression will be customized based on patient's performance and goals.          Exercise Goals and Review: Exercise Goals    Row Name 02/27/19 1109             Exercise Goals   Increase Physical Activity  Yes       Intervention  Provide advice, education, support and counseling about physical activity/exercise needs.;Develop an individualized exercise prescription for aerobic and resistive training based on initial evaluation findings, risk stratification, comorbidities and participant's personal goals.       Expected Outcomes  Short Term: Attend rehab on a regular basis to increase amount of physical activity.;Long Term: Add in home exercise to make exercise part of routine and to increase amount of physical activity.;Long Term: Exercising regularly at least 3-5 days a week.       Increase Strength and Stamina  Yes       Intervention  Provide advice, education, support and counseling about physical activity/exercise needs.;Develop an individualized exercise prescription for aerobic and resistive training based on initial evaluation findings, risk stratification, comorbidities and participant's personal goals.       Expected Outcomes  Short Term: Increase workloads from initial exercise prescription for resistance, speed, and METs.;Short Term: Perform resistance training exercises routinely during rehab and add in resistance training at home;Long Term: Improve cardiorespiratory fitness, muscular endurance and strength as measured by increased METs and functional capacity (6MWT)       Able to understand and use rate of perceived exertion (RPE) scale  Yes       Intervention  Provide education and explanation on how to use RPE scale       Expected Outcomes  Short Term: Able to use RPE daily in rehab to express subjective intensity level;Long Term:  Able to use RPE to guide intensity level when exercising  independently       Knowledge and understanding of Target Heart Rate Range (THRR)  Yes       Intervention  Provide education and explanation of THRR including how the numbers were predicted and where they are located for reference  Expected Outcomes  Short Term: Able to state/look up THRR;Short Term: Able to use daily as guideline for intensity in rehab;Long Term: Able to use THRR to govern intensity when exercising independently       Able to check pulse independently  Yes       Intervention  Provide education and demonstration on how to check pulse in carotid and radial arteries.;Review the importance of being able to check your own pulse for safety during independent exercise       Expected Outcomes  Short Term: Able to explain why pulse checking is important during independent exercise;Long Term: Able to check pulse independently and accurately       Understanding of Exercise Prescription  Yes       Intervention  Provide education, explanation, and written materials on patient's individual exercise prescription       Expected Outcomes  Long Term: Able to explain home exercise prescription to exercise independently;Short Term: Able to explain program exercise prescription          Exercise Goals Re-Evaluation : Exercise Goals Re-Evaluation    [place] Name 03/04/19 0800 03/07/19 0745 03/13/19 0740 03/18/19 1520       Exercise Goal Re-Evaluation   Exercise Goals Review  Increase Physical Activity;Increase Strength and Stamina;Able to understand and use rate of perceived exertion (RPE) scale;Able to understand and use Dyspnea scale;Knowledge and understanding of Target Heart Rate Range (THRR);Able to check pulse independently;Understanding of Exercise Prescription  Increase Physical Activity;Increase Strength and Stamina;Able to understand and use rate of perceived exertion (RPE) scale;Able to understand and use Dyspnea scale;Knowledge and understanding of Target Heart Rate Range (THRR);Able to  check pulse independently;Understanding of Exercise Prescription  Increase Physical Activity;Increase Strength and Stamina;Understanding of Exercise Prescription  Increase Physical Activity;Increase Strength and Stamina;Understanding of Exercise Prescription    Comments  Reviewed RPE scale, THR and program prescription with pt today.  Pt voiced understanding and was given a copy of goals to take home.  Shabazz is tolerating exercise well.  Staff will continue to monitor progress.  Alvis is doing well in rehab. He is off to a good start and already walking at home.  He is starting to recover his strength and stamina.  He is not doing anything too strenuous yet.  He is fatigued after work for standing and walking for 12 hours.  He naps in the afternoons.  Jammie continues to do well in rehab.  He is doing well with his workloads and should be ready to start to move them up!!  We will continue to monitor his progress.    Expected Outcomes  Short: Use RPE daily to regulate intensity. Long: Follow program prescription in THR.  Short - attend consistently Long - increase overall MET level  Short: Continue to walk on off days.  Long: Continue to increase stamina.  Short: Begin to increase workloads.  Long: Continue to improve stamina.       Discharge Exercise Prescription (Final Exercise Prescription Changes): Exercise Prescription Changes - 03/19/19 1400      Response to Exercise   Blood Pressure (Admit)  94/60    Blood Pressure (Exercise)  140/78    Blood Pressure (Exit)  94/56    Heart Rate (Admit)  68 bpm    Heart Rate (Exercise)  107 bpm    Heart Rate (Exit)  71 bpm    Rating of Perceived Exertion (Exercise)  13    Symptoms  none    Duration  Continue with 30  min of aerobic exercise without signs/symptoms of physical distress.    Intensity  THRR unchanged      Progression   Progression  Continue to progress workloads to maintain intensity without signs/symptoms of physical distress.    Average METs   3.69      Resistance Training   Training Prescription  Yes    Weight  5 lbs    Reps  10-15      Treadmill   MPH  3.3    Grade  1    Minutes  15    METs  3.98      REL-XR   Level  8    Minutes  15    METs  4.8      T5 Nustep   Level  5    Minutes  15    METs  2.3      Home Exercise Plan   Plans to continue exercise at  Home (comment)    Frequency  Add 3 additional days to program exercise sessions.    Initial Home Exercises Provided  03/11/19       Nutrition:  Target Goals: Understanding of nutrition guidelines, daily intake of sodium '1500mg'$ , cholesterol '200mg'$ , calories 30% from fat and 7% or less from saturated fats, daily to have 5 or more servings of fruits and vegetables.  Biometrics: Pre Biometrics - 02/27/19 1112      Pre Biometrics   Height  5' 11.2" (1.808 m)    Weight  202 lb 1.6 oz (91.7 kg)    BMI (Calculated)  28.04    Single Leg Stand  30 seconds        Nutrition Therapy Plan and Nutrition Goals: Nutrition Therapy & Goals - 02/27/19 1048      Nutrition Therapy   Diet  low Na, HH diet    Protein (specify units)  95g    Fiber  30 grams    Whole Grain Foods  3 servings    Saturated Fats  12 max. grams    Fruits and Vegetables  5 servings/day    Sodium  1.5 grams      Personal Nutrition Goals   Nutrition Goal  ST: take note of the ranges of Na you have daily  LT: have and improved EF    Comments  Pt and wife used a lot of diet culture buzz words "clean eating", "real food", "good food", "bad food", "chemicals". This RD discussed some myths and facts about food and food balance. Pt reports eating raisin bran with whole milk, either leftovers, whole grain sandwich with [place], or microwave meal (wife reports its a healthy one from the co-op), snacks are usually dry roasted/unsalted nuts or fruit, dinner is either chicken (red meat 2x/week) or plant protein like bean salad, whole grains, and vegetables. Pt and wife report eating mostly fresh foods and  food from their garden. They choose healthy fats like olive oil and will use butter (but not as much). on weekends pt will have an egg with an english muffin. Pt wife reports cooking almost all the food from scratch and using salt which she has tried to cut back. Disucssed HH eating, low sodium eating, and fiber. Pt reports he likes what he is eating.      Intervention Plan   Intervention  Prescribe, educate and counsel regarding individualized specific dietary modifications aiming towards targeted core components such as weight, hypertension, lipid management, diabetes, heart failure and other comorbidities.;Nutrition handout(s) given to patient.  Expected Outcomes  Short Term Goal: Understand basic principles of dietary content, such as calories, fat, sodium, cholesterol and nutrients.;Short Term Goal: A plan has been developed with personal nutrition goals set during dietitian appointment.;Long Term Goal: Adherence to prescribed nutrition plan.       Nutrition Assessments: Nutrition Assessments - 02/27/19 1048      MEDFICTS Scores   Pre Score  32       Nutrition Goals Re-Evaluation:   Nutrition Goals Discharge (Final Nutrition Goals Re-Evaluation):   Psychosocial: Target Goals: Acknowledge presence or absence of significant depression and/or stress, maximize coping skills, provide positive support system. Participant is able to verbalize types and ability to use techniques and skills needed for reducing stress and depression.   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 He is glad to be rid of his LifeVest, but wanting to make sure his heart is okay. He and his wife are making lifestyle changes and he wants to get more education regarding his health's future.      Family Dynamics    Good Support System?  Yes      Barriers   Psychosocial barriers to participate in program  There are no identifiable barriers or psychosocial needs.;The patient should benefit from training in stress management and relaxation.      Screening Interventions   Interventions  Encouraged to exercise;To provide support and resources with identified psychosocial needs;Provide feedback about the scores to participant    Expected Outcomes  Short Term goal: Utilizing psychosocial counselor, staff and physician to assist with identification of specific Stressors or current issues interfering with healing process. Setting desired goal for each stressor or current issue identified.;Long Term Goal: Stressors or current issues are controlled or eliminated.;Short Term goal: Identification and review with participant of any Quality of Life or Depression concerns found by scoring the questionnaire.;Long Term goal: The participant improves quality of Life and PHQ9 Scores as seen by post scores and/or verbalization of changes       Quality of Life Scores:  Quality of Life - 02/27/19 1115      Quality of Life   Select  Quality of Life      Quality of Life Scores   Health/Function Pre  27.83 %    Socioeconomic Pre  28.07 %    Psych/Spiritual Pre  28.29 %    Family Pre  30 %    GLOBAL Pre  28.29 %      Scores of 19 and below usually indicate a poorer quality of life in these areas.  A difference of  2-3 points is a clinically meaningful difference.  A difference of 2-3 points in the total score of the Quality of Life Index has been associated with significant improvement in overall quality of life, self-image, physical symptoms, and general health in studies assessing change in quality of life.  PHQ-9: Recent Review Flowsheet Data    Depression screen [facility] 2/9 02/27/2019   Decreased Interest 0   Down, Depressed, Hopeless 0   PHQ - 2 Score 0   Altered sleeping 1   Tired, decreased energy 1   Change in  appetite 0   Feeling bad or failure about yourself  0   Trouble concentrating 0  Moving slowly or fidgety/restless 0   Suicidal thoughts 0   PHQ-9 Score 2   Difficult doing work/chores Not difficult at all     Interpretation of Total Score  Total Score Depression Severity:  1-4 = Minimal depression, 5-9 = Mild depression, 10-14 = Moderate depression, 15-19 = Moderately severe depression, 20-27 = Severe depression   Psychosocial Evaluation and Intervention:   Psychosocial Re-Evaluation: Psychosocial Re-Evaluation    [place] Name 03/13/19 0745             Psychosocial Re-Evaluation   Current issues with  Current Sleep Concerns       Comments  Nhan is doing well overall.  He is fatigued in afternoon after working 12 hour shifts.  He usually will sleep in 4 hour bouts and then wake and go back to sleep.  He used to work third shift and it still sticks with him.  Overall, he is doing well.       Expected Outcomes  Short: Continue to try to sleep better and get his 6-7 hours.  Long: Continue to make time for self and exercise.       Interventions  Encouraged to attend Cardiac Rehabilitation for the exercise       Continue Psychosocial Services   Follow up required by staff          Psychosocial Discharge (Final Psychosocial Re-Evaluation): Psychosocial Re-Evaluation - 03/13/19 0745      Psychosocial Re-Evaluation   Current issues with  Current Sleep Concerns    Comments  Keven is doing well overall.  He is fatigued in afternoon after working 12 hour shifts.  He usually will sleep in 4 hour bouts and then wake and go back to sleep.  He used to work third shift and it still sticks with him.  Overall, he is doing well.    Expected Outcomes  Short: Continue to try to sleep better and get his 6-7 hours.  Long: Continue to make time for self and exercise.    Interventions  Encouraged to attend Cardiac Rehabilitation for the exercise    Continue Psychosocial Services   Follow up required by  staff       Vocational Rehabilitation: Provide vocational rehab assistance to qualifying candidates.   Vocational Rehab Evaluation & Intervention: Vocational Rehab - 02/26/19 1039      Initial Vocational Rehab Evaluation & Intervention   Assessment shows need for Vocational Rehabilitation  No       Education: Education Goals: Education classes will be provided on a variety of topics geared toward better understanding of heart health and risk factor modification. Participant will state understanding/return demonstration of topics presented as noted by education test scores.  Learning Barriers/Preferences: Learning Barriers/Preferences - 02/26/19 1039      Learning Barriers/Preferences   Learning Barriers  None    Learning Preferences  None       Education Topics:  AED/CPR: - Group verbal and written instruction with the use of models to demonstrate the basic use of the AED with the basic ABC's of resuscitation.   General Nutrition Guidelines/Fats and Fiber: -Group instruction provided by verbal, written material, models and posters to present the general guidelines for heart healthy nutrition. Gives an explanation and review of dietary fats and fiber.   Controlling Sodium/Reading Food Labels: -Group verbal and written material supporting the discussion of sodium use in heart healthy nutrition. Review and explanation with models, verbal and written materials for utilization of the food label.  Exercise Physiology & General Exercise Guidelines: - Group verbal and written instruction with models to review the exercise physiology of the cardiovascular system and associated critical values. Provides general exercise guidelines with specific guidelines to those with heart or lung disease.    Aerobic Exercise & Resistance Training: - Gives group verbal and written instruction on the various components of exercise. Focuses on aerobic and resistive training programs and the  benefits of this training and how to safely progress through these programs..   Flexibility, Balance, Mind/Body Relaxation: Provides group verbal/written instruction on the benefits of flexibility and balance training, including mind/body exercise modes such as yoga, pilates and tai chi.  Demonstration and skill practice provided.   Stress and Anxiety: - Provides group verbal and written instruction about the health risks of elevated stress and causes of high stress.  Discuss the correlation between heart/lung disease and anxiety and treatment options. Review healthy ways to manage with stress and anxiety.   Depression: - Provides group verbal and written instruction on the correlation between heart/lung disease and depressed mood, treatment options, and the stigmas associated with seeking treatment.   Anatomy & Physiology of the Heart: - Group verbal and written instruction and models provide basic cardiac anatomy and physiology, with the coronary electrical and arterial systems. Review of Valvular disease and Heart Failure   Cardiac Procedures: - Group verbal and written instruction to review commonly prescribed medications for heart disease. Reviews the medication, class of the drug, and side effects. Includes the steps to properly store meds and maintain the prescription regimen. (beta blockers and nitrates)   Cardiac Medications I: - Group verbal and written instruction to review commonly prescribed medications for heart disease. Reviews the medication, class of the drug, and side effects. Includes the steps to properly store meds and maintain the prescription regimen.   Cardiac Medications II: -Group verbal and written instruction to review commonly prescribed medications for heart disease. Reviews the medication, class of the drug, and side effects. (all other drug classes)    Go Sex-Intimacy & Heart Disease, Get SMART - Goal Setting: - Group verbal and written instruction  through game format to discuss heart disease and the return to sexual intimacy. Provides group verbal and written material to discuss and apply goal setting through the application of the S.M.A.R.T. Method.   Other Matters of the Heart: - Provides group verbal, written materials and models to describe Stable Angina and Peripheral Artery. Includes description of the disease process and treatment options available to the cardiac patient.   Exercise & Equipment Safety: - Individual verbal instruction and demonstration of equipment use and safety with use of the equipment.   Cardiac Rehab from 02/27/2019 in [facility] Cardiac and Pulmonary Rehab  Date  02/27/19  Educator  [facility]  Instruction Review Code  1- Verbalizes Understanding      Infection Prevention: - Provides verbal and written material to individual with discussion of infection control including proper hand washing and proper equipment cleaning during exercise session.   Cardiac Rehab from 02/27/2019 in [facility] Cardiac and Pulmonary Rehab  Date  02/27/19  Educator  [facility]  Instruction Review Code  1- Verbalizes Understanding      Falls Prevention: - Provides verbal and written material to individual with discussion of falls prevention and safety.   Cardiac Rehab from 02/27/2019 in [facility] Cardiac and Pulmonary Rehab  Date  02/27/19  Educator  [facility]  Instruction Review Code  1- Verbalizes Understanding      Diabetes: - Individual  verbal and written instruction to review signs/symptoms of diabetes, desired ranges of glucose level fasting, after meals and with exercise. Acknowledge that pre and post exercise glucose checks will be done for 3 sessions at entry of program.   Know Your Numbers and Risk Factors: -Group verbal and written instruction about important numbers in your health.  Discussion of what are risk factors and how they play a role in the disease process.  Review of Cholesterol, Blood Pressure, Diabetes, and BMI and the role they play  in your overall health.   Sleep Hygiene: -Provides group verbal and written instruction about how sleep can affect your health.  Define sleep hygiene, discuss sleep cycles and impact of sleep habits. Review good sleep hygiene tips.    Other: -Provides group and verbal instruction on various topics (see comments)   Knowledge Questionnaire Score:   Core Components/Risk Factors/Patient Goals at Admission: Personal Goals and Risk Factors at Admission - 02/27/19 1112      Core Components/Risk Factors/Patient Goals on Admission    Weight Management  Yes;Weight Loss    Intervention  Weight Management: Develop a combined nutrition and exercise program designed to reach desired caloric intake, while maintaining appropriate intake of nutrient and fiber, sodium and fats, and appropriate energy expenditure required for the weight goal.;Weight Management: Provide education and appropriate resources to help participant work on and attain dietary goals.    Admit Weight  202 lb 1.6 oz (91.7 kg)    Goal Weight: Short Term  195 lb (88.5 kg)    Goal Weight: Long Term  190 lb (86.2 kg)    Expected Outcomes  Short Term: Continue to assess and modify interventions until short term weight is achieved;Long Term: Adherence to nutrition and physical activity/exercise program aimed toward attainment of established weight goal;Weight Loss: Understanding of general recommendations for a balanced deficit meal plan, which promotes 1-2 lb weight loss per week and includes a negative energy balance of [phone] kcal/d;Understanding recommendations for meals to include 15-35% energy as protein, 25-35% energy from fat, 35-60% energy from carbohydrates, less than '200mg'$  of dietary cholesterol, 20-35 gm of total fiber daily;Understanding of distribution of calorie intake throughout the day with the consumption of 4-5 meals/snacks    Heart Failure  Yes    Intervention  Provide a combined exercise and nutrition program that is  supplemented with education, support and counseling about heart failure. Directed toward relieving symptoms such as shortness of breath, decreased exercise tolerance, and extremity edema.    Expected Outcomes  Improve functional capacity of life;Short term: Attendance in program 2-3 days a week with increased exercise capacity. Reported lower sodium intake. Reported increased fruit and vegetable intake. Reports medication compliance.;Short term: Daily weights obtained and reported for increase. Utilizing diuretic protocols set by physician.;Long term: Adoption of self-care skills and reduction of barriers for early signs and symptoms recognition and intervention leading to self-care maintenance.    Lipids  Yes    Intervention  Provide education and support for participant on nutrition & aerobic/resistive exercise along with prescribed medications to achieve LDL '70mg'$ , HDL >'40mg'$ .    Expected Outcomes  Short Term: Participant states understanding of desired cholesterol values and is compliant with medications prescribed. Participant is following exercise prescription and nutrition guidelines.;Long Term: Cholesterol controlled with medications as prescribed, with individualized exercise RX and with personalized nutrition plan. Value goals: LDL < '70mg'$ , HDL > 40 mg.       Core Components/Risk Factors/Patient Goals Review:  Goals and Risk Factor Review  [place] Name 03/13/19 0748             Core Components/Risk Factors/Patient Goals Review   Personal Goals Review  Weight Management/Obesity;Hypertension;Heart Failure;Lipids       Review  Jojuan is doing well with his weight. He checks it in class as he doesn't check it at home as he doesn't have a scale.  He will let us know about the scale.  Reviewed heart failure action plan with him today.  He is watching his sodium levels.  His pressures have been good in class as he does not have a cuff.  He will look into it.  We can into foundation for providing these  to him.  He is doing well with his medicaitons.       Expected Outcomes  Short: Look into getting scale and cuff for home use.  Long: Continue to monitor risk factors.          Core Components/Risk Factors/Patient Goals at Discharge (Final Review):  Goals and Risk Factor Review - 03/13/19 0748      Core Components/Risk Factors/Patient Goals Review   Personal Goals Review  Weight Management/Obesity;Hypertension;Heart Failure;Lipids    Review  Terique is doing well with his weight. He checks it in class as he doesn't check it at home as he doesn't have a scale.  He will let us know about the scale.  Reviewed heart failure action plan with him today.  He is watching his sodium levels.  His pressures have been good in class as he does not have a cuff.  He will look into it.  We can into foundation for providing these to him.  He is doing well with his medicaitons.    Expected Outcomes  Short: Look into getting scale and cuff for home use.  Long: Continue to monitor risk factors.       ITP Comments: ITP Comments    Row Name 02/26/19 1043 02/27/19 1052 03/26/19 1311       ITP Comments  Virtual initial orientation completed. Diagnosis can be found in CE 8/26. EP/RD orientation scheduled for 9/10 at 9:30  Completed 6MWT, gym orientation, and RD evaluation. Initial ITP created and sent for review to Dr. Emily Filbert, Medical Director.  30 day review completed. ITP sent to Dr. Emily Filbert, Medical Director of Cardiac and Pulmonary Rehab. Continue with ITP unless changes are made by physician.  Department closed starting 10/2 until further notice by infection prevention and Health at Work teams for [place].        Comments: 30 day review

## 2019-04-01 ENCOUNTER — Encounter: Payer: PRIVATE HEALTH INSURANCE | Admitting: *Deleted

## 2019-04-01 ENCOUNTER — Other Ambulatory Visit: Payer: Self-pay

## 2019-04-01 DIAGNOSIS — Z952 Presence of prosthetic heart valve: Secondary | ICD-10-CM

## 2019-04-01 NOTE — Progress Notes (Signed)
Daily Session Note  Patient Details  Name: Brandon Kennedy MRN: [id number] Date of Birth: 05-Jan-1961 Referring Provider:     Cardiac Rehab from 02/27/2019 in [facility] Cardiac and Pulmonary Rehab  Referring Provider  Marisa Severin MD      Encounter Date: 04/01/2019  Check In: Session Check In - 04/01/19 0759      Check-In   Supervising physician immediately available to respond to emergencies  See telemetry face sheet for immediately available ER MD    Location  [facility]-Cardiac & Pulmonary Rehab    Staff Present  Heath Lark, RN, BSN, CCRP;Joseph Hood RCP,RRT,BSRT;Laureen Chrisman, [place], RRT, CPFT    Virtual Visit  No    Medication changes reported      No    Fall or balance concerns reported     No    Warm-up and Cool-down  Performed on first and last piece of equipment    Resistance Training Performed  Yes    VAD Patient?  No    PAD/SET Patient?  No      Pain Assessment   Currently in Pain?  No/denies          Social History   Tobacco Use  Smoking Status Never Smoker  Smokeless Tobacco Never Used    Goals Met:  Independence with exercise equipment Exercise tolerated well No report of cardiac concerns or symptoms  Goals Unmet:  Not Applicable  Comments: Pt able to follow exercise prescription today without complaint.  Will continue to monitor for progression.    Dr. Emily Filbert is Medical Director for [place] and [facility].

## 2019-04-03 ENCOUNTER — Encounter: Payer: PRIVATE HEALTH INSURANCE | Admitting: *Deleted

## 2019-04-03 ENCOUNTER — Other Ambulatory Visit: Payer: Self-pay

## 2019-04-03 DIAGNOSIS — Z952 Presence of prosthetic heart valve: Secondary | ICD-10-CM | POA: Diagnosis not present

## 2019-04-03 NOTE — Progress Notes (Signed)
Daily Session Note  Patient Details  Name: Brandon Kennedy MRN: [id number] Date of Birth: 1960-07-28 Referring Provider:     Cardiac Rehab from 02/27/2019 in [facility] Cardiac and Pulmonary Rehab  Referring Provider  Marisa Severin MD      Encounter Date: 04/03/2019  Check In: Session Check In - 04/03/19 0734      Check-In   Supervising physician immediately available to respond to emergencies  See telemetry face sheet for immediately available ER MD    Location  [facility]-Cardiac & Pulmonary Rehab    Staff Present  Heath Lark, RN, BSN, CCRP;Donatello Kleve [place], MA, RCEP, CCRP, CCET;Joseph [place] RCP,RRT,BSRT    Virtual Visit  No    Medication changes reported      No    Fall or balance concerns reported     No    Warm-up and Cool-down  Performed on first and last piece of equipment    Resistance Training Performed  Yes    VAD Patient?  No    PAD/SET Patient?  No      Pain Assessment   Currently in Pain?  No/denies          Social History   Tobacco Use  Smoking Status Never Smoker  Smokeless Tobacco Never Used    Goals Met:  Independence with exercise equipment Exercise tolerated well Personal goals reviewed No report of cardiac concerns or symptoms Strength training completed today  Goals Unmet:  Not Applicable  Comments: Pt able to follow exercise prescription today without complaint.  Will continue to monitor for progression.    Dr. Emily Filbert is Medical Director for [place] and [facility].

## 2019-04-08 ENCOUNTER — Encounter: Payer: PRIVATE HEALTH INSURANCE | Admitting: *Deleted

## 2019-04-08 ENCOUNTER — Other Ambulatory Visit: Payer: Self-pay

## 2019-04-08 DIAGNOSIS — Z952 Presence of prosthetic heart valve: Secondary | ICD-10-CM | POA: Diagnosis not present

## 2019-04-08 NOTE — Progress Notes (Signed)
Daily Session Note  Patient Details  Name: Brandon Kennedy MRN: [id number] Date of Birth: 04/26/1961 Referring Provider:     Cardiac Rehab from 02/27/2019 in [facility] Cardiac and Pulmonary Rehab  Referring Provider  Marisa Severin MD      Encounter Date: 04/08/2019  Check In: Session Check In - 04/08/19 0825      Check-In   Supervising physician immediately available to respond to emergencies  See telemetry face sheet for immediately available ER MD    Location  [facility]-Cardiac & Pulmonary Rehab    Staff Present  Heath Lark, RN, BSN, CCRP;Amanda Sommer, BA, ACSM CEP, Exercise Physiologist;Joseph Hood RCP,RRT,BSRT    Virtual Visit  No    Medication changes reported      No    Fall or balance concerns reported     No    Warm-up and Cool-down  Performed on first and last piece of equipment    Resistance Training Performed  Yes    VAD Patient?  No    PAD/SET Patient?  No      Pain Assessment   Currently in Pain?  No/denies          Social History   Tobacco Use  Smoking Status Never Smoker  Smokeless Tobacco Never Used    Goals Met:  Independence with exercise equipment Exercise tolerated well No report of cardiac concerns or symptoms  Goals Unmet:  Not Applicable  Comments: Pt able to follow exercise prescription today without complaint.  Will continue to monitor for progression.    Dr. Emily Filbert is Medical Director for [place] and [facility].

## 2019-04-10 ENCOUNTER — Encounter: Payer: PRIVATE HEALTH INSURANCE | Admitting: *Deleted

## 2019-04-10 ENCOUNTER — Other Ambulatory Visit: Payer: Self-pay

## 2019-04-10 DIAGNOSIS — Z952 Presence of prosthetic heart valve: Secondary | ICD-10-CM | POA: Diagnosis not present

## 2019-04-10 NOTE — Progress Notes (Signed)
Daily Session Note  Patient Details  Name: Brandon Kennedy MRN: [id number] Date of Birth: 1961-04-03 Referring Provider:     Cardiac Rehab from 02/27/2019 in [facility] Cardiac and Pulmonary Rehab  Referring Provider  Marisa Severin MD      Encounter Date: 04/10/2019  Check In: Session Check In - 04/10/19 0756      Check-In   Supervising physician immediately available to respond to emergencies  See telemetry face sheet for immediately available ER MD    Location  [facility]-Cardiac & Pulmonary Rehab    Staff Present  Heath Lark, RN, BSN, CCRP;Jeanna Durrell BS, Exercise Physiologist;Amanda Oletta Darter, BA, ACSM CEP, Exercise Physiologist    Virtual Visit  No    Medication changes reported      No    Fall or balance concerns reported     No    Warm-up and Cool-down  Performed on first and last piece of equipment    Resistance Training Performed  Yes    VAD Patient?  No    PAD/SET Patient?  No      Pain Assessment   Currently in Pain?  No/denies          Social History   Tobacco Use  Smoking Status Never Smoker  Smokeless Tobacco Never Used    Goals Met:  Independence with exercise equipment Exercise tolerated well No report of cardiac concerns or symptoms  Goals Unmet:  Not Applicable  Comments: Pt able to follow exercise prescription today without complaint.  Will continue to monitor for progression.    Dr. Emily Filbert is Medical Director for [place] and [facility].

## 2019-04-15 ENCOUNTER — Other Ambulatory Visit: Payer: Self-pay

## 2019-04-15 ENCOUNTER — Encounter: Payer: PRIVATE HEALTH INSURANCE | Admitting: *Deleted

## 2019-04-15 DIAGNOSIS — Z952 Presence of prosthetic heart valve: Secondary | ICD-10-CM

## 2019-04-15 NOTE — Progress Notes (Signed)
Daily Session Note  Patient Details  Name: Brandon Kennedy MRN: [id number] Date of Birth: 07-12-1960 Referring Provider:     Cardiac Rehab from 02/27/2019 in [facility] Cardiac and Pulmonary Rehab  Referring Provider  Marisa Severin MD      Encounter Date: 04/15/2019  Check In: Session Check In - 04/15/19 0743      Check-In   Supervising physician immediately available to respond to emergencies  See telemetry face sheet for immediately available ER MD    Location  [facility]-Cardiac & Pulmonary Rehab    Staff Present  Darel Hong, RN BSN;Joseph [address] [place], [place], ACSM CEP, Exercise Physiologist    Virtual Visit  No    Medication changes reported      No    Fall or balance concerns reported     No    Tobacco Cessation  No Change    Warm-up and Cool-down  Performed on first and last piece of equipment    Resistance Training Performed  Yes    VAD Patient?  No      Pain Assessment   Currently in Pain?  No/denies          Social History   Tobacco Use  Smoking Status Never Smoker  Smokeless Tobacco Never Used    Goals Met:  Independence with exercise equipment Achieving weight loss Personal goals reviewed No report of cardiac concerns or symptoms Strength training completed today  Goals Unmet:  Not Applicable  Comments: Pt able to follow exercise prescription today without complaint.  Will continue to monitor for progression.    Dr. Emily Filbert is Medical Director for [place] and [facility].

## 2019-04-17 ENCOUNTER — Other Ambulatory Visit: Payer: Self-pay

## 2019-04-17 ENCOUNTER — Encounter: Payer: PRIVATE HEALTH INSURANCE | Admitting: *Deleted

## 2019-04-17 DIAGNOSIS — Z952 Presence of prosthetic heart valve: Secondary | ICD-10-CM | POA: Diagnosis not present

## 2019-04-17 NOTE — Progress Notes (Signed)
Daily Session Note  Patient Details  Name: Brandon Kennedy MRN: [id number] Date of Birth: [birth year] Referring Provider:     Cardiac Rehab from 02/27/2019 in [facility] Cardiac and Pulmonary Rehab  Referring Provider  Marisa Severin MD      Encounter Date: 04/17/2019  Check In: Session Check In - 04/17/19 0746      Check-In   Supervising physician immediately available to respond to emergencies  See telemetry face sheet for immediately available ER MD    Location  [facility]-Cardiac & Pulmonary Rehab    Staff Present  Heath Lark, RN, BSN, CCRP;Jessica [place], MA, RCEP, CCRP, [place], [place], ACSM CEP, Exercise Physiologist    Virtual Visit  No    Medication changes reported      No    Fall or balance concerns reported     No    Warm-up and Cool-down  Performed on first and last piece of equipment    Resistance Training Performed  Yes    VAD Patient?  No    PAD/SET Patient?  No      VAD patient   Has back up controller?  No      Pain Assessment   Currently in Pain?  No/denies          Social History   Tobacco Use  Smoking Status Never Smoker  Smokeless Tobacco Never Used    Goals Met:  Independence with exercise equipment Exercise tolerated well No report of cardiac concerns or symptoms  Goals Unmet:  Not Applicable  Comments: Pt able to follow exercise prescription today without complaint.  Will continue to monitor for progression.    Dr. Emily Filbert is Medical Director for [place] and [facility].

## 2019-04-22 ENCOUNTER — Encounter: Payer: PRIVATE HEALTH INSURANCE | Attending: Internal Medicine | Admitting: *Deleted

## 2019-04-22 ENCOUNTER — Other Ambulatory Visit: Payer: Self-pay

## 2019-04-22 DIAGNOSIS — Z952 Presence of prosthetic heart valve: Secondary | ICD-10-CM

## 2019-04-22 NOTE — Progress Notes (Signed)
Daily Session Note  Patient Details  Name: Noboru Bidinger MRN: [id number] Date of Birth: Jul 13, 1960 Referring Provider:     Cardiac Rehab from 02/27/2019 in [facility] Cardiac and Pulmonary Rehab  Referring Provider  Marisa Severin MD      Encounter Date: 04/22/2019  Check In: Session Check In - 04/22/19 7944      Check-In   Supervising physician immediately available to respond to emergencies  See telemetry face sheet for immediately available ER MD    Location  [facility]-Cardiac & Pulmonary Rehab    Staff Present  Heath Lark, RN, BSN, CCRP;Joseph Foy [place], [place], ACSM CEP, Exercise Physiologist    Virtual Visit  No    Medication changes reported      No    Fall or balance concerns reported     No    Warm-up and Cool-down  Performed on first and last piece of equipment    Resistance Training Performed  Yes    VAD Patient?  No    PAD/SET Patient?  No      VAD patient   Has back up controller?  No      Pain Assessment   Currently in Pain?  No/denies          Social History   Tobacco Use  Smoking Status Never Smoker  Smokeless Tobacco Never Used    Goals Met:  Independence with exercise equipment Exercise tolerated well No report of cardiac concerns or symptoms  Goals Unmet:  Not Applicable  Comments: Pt able to follow exercise prescription today without complaint.  Will continue to monitor for progression.    Dr. Emily Filbert is Medical Director for [place] and [facility].

## 2019-04-23 ENCOUNTER — Encounter: Payer: Self-pay | Admitting: *Deleted

## 2019-04-23 DIAGNOSIS — Z952 Presence of prosthetic heart valve: Secondary | ICD-10-CM

## 2019-04-23 NOTE — Progress Notes (Signed)
Cardiac Individual Treatment Plan  Patient Details  Name: Brandon Kennedy MRN: [id number] Date of Birth: 01-27-1961 Referring Provider:     Cardiac Rehab from 02/27/2019 in [facility] Cardiac and Pulmonary Rehab  Referring Provider  Marisa Severin MD      Initial Encounter Date:    Cardiac Rehab from 02/27/2019 in [facility] Cardiac and Pulmonary Rehab  Date  02/27/19      Visit Diagnosis: S/P TAVR (transcatheter aortic valve replacement)  Patient's Home Medications on Admission:  Current Outpatient Medications:  .  acetaminophen (TYLENOL) 500 MG tablet, Take by mouth., Disp: , Rfl:  .  aspirin EC 81 MG tablet, Take by mouth., Disp: , Rfl:  .  losartan (COZAAR) 25 MG tablet, Take by mouth., Disp: , Rfl:  .  metoprolol succinate (TOPROL-XL) 25 MG 24 hr tablet, Take by mouth., Disp: , Rfl:   Past Medical History: No past medical history on file.  Tobacco Use: Social History   Tobacco Use  Smoking Status Never Smoker  Smokeless Tobacco Never Used    Labs: Recent Review Flowsheet Data    There is no flowsheet data to display.       Exercise Target Goals: Exercise Program Goal: Individual exercise prescription set using results from initial 6 min walk test and THRR while considering  patient's activity barriers and safety.   Exercise Prescription Goal: Initial exercise prescription builds to 30-45 minutes a day of aerobic activity, 2-3 days per week.  Home exercise guidelines will be given to patient during program as part of exercise prescription that the participant will acknowledge.  Activity Barriers & Risk Stratification: Activity Barriers & Cardiac Risk Stratification - 02/27/19 1054      Activity Barriers & Cardiac Risk Stratification   Activity Barriers  Arthritis;Joint Problems;Decreased Ventricular Function;Muscular Weakness   bilateral knee pain   Cardiac Risk Stratification  Low       6 Minute Walk: 6 Minute Walk    Row Name 02/27/19 1052         6 Minute  Walk   Phase  Initial     Distance  1806 feet     Walk Time  6 minutes     # of Rest Breaks  0     MPH  3.42     METS  5.13     RPE  14     VO2 Peak  17.97     Symptoms  No     Resting HR  85 bpm     Resting BP  142/86     Resting Oxygen Saturation   98 %     Exercise Oxygen Saturation  during 6 min walk  99 %     Max Ex. HR  128 bpm     Max Ex. BP  184/74 recheck BP prior to resistance training 164/72     2 Minute Post BP  142/74        Oxygen Initial Assessment:   Oxygen Re-Evaluation:   Oxygen Discharge (Final Oxygen Re-Evaluation):   Initial Exercise Prescription: Initial Exercise Prescription - 02/27/19 1000      Date of Initial Exercise RX and Referring Provider   Date  02/27/19    Referring Provider  Marisa Severin MD      Treadmill   MPH  3.3    Grade  1    Minutes  15    METs  3.71      REL-XR   Level  3  Speed  50    Minutes  15    METs  3.5      T5 Nustep   Level  4    SPM  80    Minutes  15    METs  3.5      Prescription Details   Frequency (times per week)  2    Duration  Progress to 30 minutes of continuous aerobic without signs/symptoms of physical distress      Intensity   THRR 40-80% of Max Heartrate  115-145    Ratings of Perceived Exertion  11-13    Perceived Dyspnea  0-4      Progression   Progression  Continue to progress workloads to maintain intensity without signs/symptoms of physical distress.      Resistance Training   Training Prescription  Yes    Weight  4 lbs    Reps  10-15       Perform Capillary Blood Glucose checks as needed.  Exercise Prescription Changes: Exercise Prescription Changes    Row Name 02/27/19 1100 03/07/19 0700 03/11/19 1400 03/19/19 1400 04/02/19 1000     Response to Exercise   Blood Pressure (Admit)  142/86  128/64  -  94/60  126/66   Blood Pressure (Exercise)  184/74 rck 164/72  146/62  -  140/78  144/64   Blood Pressure (Exit)  142/74  124/70  -  94/56  118/64   Heart Rate (Admit)   85 bpm  68 bpm  -  68 bpm  63 bpm   Heart Rate (Exercise)  128 bpm  127 bpm  -  107 bpm  102 bpm   Heart Rate (Exit)  92 bpm  -  -  71 bpm  83 bpm   Oxygen Saturation (Admit)  98 %  -  -  -  -   Oxygen Saturation (Exercise)  99 %  -  -  -  -   Rating of Perceived Exertion (Exercise)  14  13  -  13  13   Symptoms  none  none  -  none  none   Comments  walk test results  -  -  -  -   Duration  -  Continue with 30 min of aerobic exercise without signs/symptoms of physical distress.  -  Continue with 30 min of aerobic exercise without signs/symptoms of physical distress.  Continue with 30 min of aerobic exercise without signs/symptoms of physical distress.   Intensity  -  THRR unchanged  -  THRR unchanged  THRR unchanged     Progression   Progression  -  -  -  Continue to progress workloads to maintain intensity without signs/symptoms of physical distress.  Continue to progress workloads to maintain intensity without signs/symptoms of physical distress.   Average METs  -  -  -  3.69  3.33     Resistance Training   Training Prescription  -  Yes  -  Yes  Yes   Weight  -  4 lb  -  5 lbs  5 lbs   Reps  -  10-15  -  10-15  10-15     Interval Training   Interval Training  -  No  -  -  No     Treadmill   MPH  -  3  -  3.3  3.3   Grade  -  1  -  1  1   Minutes  -  15  -  15  15   METs  -  3.71  -  3.98  3.98     REL-XR   Level  -  3  -  8  8   Speed  -  50  -  -  -   Minutes  -  15  -  15  15   METs  -  2.8  -  4.8  3.1     T5 Nustep   Level  -  4  -  5  5   SPM  -  80  -  -  -   Minutes  -  15  -  15  15   METs  -  2.7  -  2.3  2.9     Home Exercise Plan   Plans to continue exercise at  -  -  Home (comment)  Home (comment)  Home (comment)   Frequency  -  -  Add 3 additional days to program exercise sessions.  Add 3 additional days to program exercise sessions.  Add 3 additional days to program exercise sessions.   Initial Home Exercises Provided  -  -  03/11/19  03/11/19  03/11/19    Row Name 04/15/19 1400             Response to Exercise   Blood Pressure (Admit)  110/60       Blood Pressure (Exercise)  142/72       Blood Pressure (Exit)  104/64       Heart Rate (Admit)  57 bpm       Heart Rate (Exercise)  123 bpm       Heart Rate (Exit)  72 bpm       Rating of Perceived Exertion (Exercise)  13       Symptoms  none       Duration  Continue with 30 min of aerobic exercise without signs/symptoms of physical distress.       Intensity  THRR unchanged         Progression   Progression  Continue to progress workloads to maintain intensity without signs/symptoms of physical distress.       Average METs  3.6         Resistance Training   Training Prescription  Yes       Weight  5 lb       Reps  10-15         Interval Training   Interval Training  No         Treadmill   MPH  3.3       Grade  1       Minutes  15       METs  3.98         REL-XR   Level  8       Minutes  15       METs  4.8         T5 Nustep   Level  6       Minutes  15       METs  2.4         Home Exercise Plan   Plans to continue exercise at  Home (comment)       Frequency  Add 3 additional days to program exercise sessions.          Exercise Comments: Exercise Comments    Row Name 03/04/19 0800  Exercise Comments  First full day of exercise!  Patient was oriented to gym and equipment including functions, settings, policies, and procedures.  Patient's individual exercise prescription and treatment plan were reviewed.  All starting workloads were established based on the results of the 6 minute walk test done at initial orientation visit.  The plan for exercise progression was also introduced and progression will be customized based on patient's performance and goals.          Exercise Goals and Review: Exercise Goals    Row Name 02/27/19 1109             Exercise Goals   Increase Physical Activity  Yes       Intervention  Provide advice, education, support  and counseling about physical activity/exercise needs.;Develop an individualized exercise prescription for aerobic and resistive training based on initial evaluation findings, risk stratification, comorbidities and participant's personal goals.       Expected Outcomes  Short Term: Attend rehab on a regular basis to increase amount of physical activity.;Long Term: Add in home exercise to make exercise part of routine and to increase amount of physical activity.;Long Term: Exercising regularly at least 3-5 days a week.       Increase Strength and Stamina  Yes       Intervention  Provide advice, education, support and counseling about physical activity/exercise needs.;Develop an individualized exercise prescription for aerobic and resistive training based on initial evaluation findings, risk stratification, comorbidities and participant's personal goals.       Expected Outcomes  Short Term: Increase workloads from initial exercise prescription for resistance, speed, and METs.;Short Term: Perform resistance training exercises routinely during rehab and add in resistance training at home;Long Term: Improve cardiorespiratory fitness, muscular endurance and strength as measured by increased METs and functional capacity (6MWT)       Able to understand and use rate of perceived exertion (RPE) scale  Yes       Intervention  Provide education and explanation on how to use RPE scale       Expected Outcomes  Short Term: Able to use RPE daily in rehab to express subjective intensity level;Long Term:  Able to use RPE to guide intensity level when exercising independently       Knowledge and understanding of Target Heart Rate Range (THRR)  Yes       Intervention  Provide education and explanation of THRR including how the numbers were predicted and where they are located for reference       Expected Outcomes  Short Term: Able to state/look up THRR;Short Term: Able to use daily as guideline for intensity in rehab;Long  Term: Able to use THRR to govern intensity when exercising independently       Able to check pulse independently  Yes       Intervention  Provide education and demonstration on how to check pulse in carotid and radial arteries.;Review the importance of being able to check your own pulse for safety during independent exercise       Expected Outcomes  Short Term: Able to explain why pulse checking is important during independent exercise;Long Term: Able to check pulse independently and accurately       Understanding of Exercise Prescription  Yes       Intervention  Provide education, explanation, and written materials on patient's individual exercise prescription       Expected Outcomes  Long Term: Able to explain home exercise prescription to exercise independently;Short Term: Able  to explain program exercise prescription          Exercise Goals Re-Evaluation : Exercise Goals Re-Evaluation    Row Name 03/04/19 0800 03/07/19 0745 03/13/19 0740 03/18/19 1520 04/01/19 1545     Exercise Goal Re-Evaluation   Exercise Goals Review  Increase Physical Activity;Increase Strength and Stamina;Able to understand and use rate of perceived exertion (RPE) scale;Able to understand and use Dyspnea scale;Knowledge and understanding of Target Heart Rate Range (THRR);Able to check pulse independently;Understanding of Exercise Prescription  Increase Physical Activity;Increase Strength and Stamina;Able to understand and use rate of perceived exertion (RPE) scale;Able to understand and use Dyspnea scale;Knowledge and understanding of Target Heart Rate Range (THRR);Able to check pulse independently;Understanding of Exercise Prescription  Increase Physical Activity;Increase Strength and Stamina;Understanding of Exercise Prescription  Increase Physical Activity;Increase Strength and Stamina;Understanding of Exercise Prescription  Increase Physical Activity;Increase Strength and Stamina;Understanding of Exercise Prescription    Comments  Reviewed RPE scale, THR and program prescription with pt today.  Pt voiced understanding and was given a copy of goals to take home.  Lorin is tolerating exercise well.  Staff will continue to monitor progress.  Abednego is doing well in rehab. He is off to a good start and already walking at home.  He is starting to recover his strength and stamina.  He is not doing anything too strenuous yet.  He is fatigued after work for standing and walking for 12 hours.  He naps in the afternoons.  Keny[place] continues to do well in rehab.  He is doing well with his workloads and should be ready to start to move them up!!  We will continue to monitor his progress.  Navon has been doing well in rehab.  He has increased almost all of his workloads!  We will continue to monitor his progression.   Expected Outcomes  Short: Use RPE daily to regulate intensity. Long: Follow program prescription in THR.  Short - attend consistently Long - increase overall MET level  Short: Continue to walk on off days.  Long: Continue to increase stamina.  Short: Begin to increase workloads.  Long: Continue to improve stamina.  Short: Review home exercise guidelines.  Long: Continue to improve stamina.   [place] Name 04/03/19 4034 04/15/19 1406           Exercise Goal Re-Evaluation   Exercise Goals Review  Increase Physical Activity;Increase Strength and Stamina;Understanding of Exercise Prescription  Increase Physical Activity;Increase Strength and Stamina;Able to understand and use rate of perceived exertion (RPE) scale;Knowledge and understanding of Target Heart Rate Range (THRR);Able to check pulse independently;Understanding of Exercise Prescription      Comments  Carolos continues to do well in rehab. He is working hard in class and exercising at home. He also stays active by gardening.  He has noticed that he is now able to do more in his garden without getting as short of breath as during the summer.  [place]thorne is continuing to attend consistently  and work in correct THR and RPE range.  He also exercsises at home.      Expected Outcomes  Short: Continue to exericse at home on off day.  Long: Continue to work on stamina in garden.  Short - continue to be consistent and increase workloads Long - increase overall MET level         Discharge Exercise Prescription (Final Exercise Prescription Changes): Exercise Prescription Changes - 04/15/19 1400      Response to Exercise   Blood Pressure (Admit)  110/60    Blood Pressure (Exercise)  142/72    Blood Pressure (Exit)  104/64    Heart Rate (Admit)  57 bpm    Heart Rate (Exercise)  123 bpm    Heart Rate (Exit)  72 bpm    Rating of Perceived Exertion (Exercise)  13    Symptoms  none    Duration  Continue with 30 min of aerobic exercise without signs/symptoms of physical distress.    Intensity  THRR unchanged      Progression   Progression  Continue to progress workloads to maintain intensity without signs/symptoms of physical distress.    Average METs  3.6      Resistance Training   Training Prescription  Yes    Weight  5 lb    Reps  10-15      Interval Training   Interval Training  No      Treadmill   MPH  3.3    Grade  1    Minutes  15    METs  3.98      REL-XR   Level  8    Minutes  15    METs  4.8      T5 Nustep   Level  6    Minutes  15    METs  2.4      Home Exercise Plan   Plans to continue exercise at  Home (comment)    Frequency  Add 3 additional days to program exercise sessions.       Nutrition:  Target Goals: Understanding of nutrition guidelines, daily intake of sodium '1500mg'$ , cholesterol '200mg'$ , calories 30% from fat and 7% or less from saturated fats, daily to have 5 or more servings of fruits and vegetables.  Biometrics: Pre Biometrics - 02/27/19 1112      Pre Biometrics   Height  5' 11.2" (1.808 m)    Weight  202 lb 1.6 oz (91.7 kg)    BMI (Calculated)  28.04    Single Leg Stand  30 seconds        Nutrition Therapy Plan and  Nutrition Goals: Nutrition Therapy & Goals - 02/27/19 1048      Nutrition Therapy   Diet  low Na, HH diet    Protein (specify units)  95g    Fiber  30 grams    Whole Grain Foods  3 servings    Saturated Fats  12 max. grams    Fruits and Vegetables  5 servings/day    Sodium  1.5 grams      Personal Nutrition Goals   Nutrition Goal  ST: take note of the ranges of Na you have daily  LT: have and improved EF    Comments  Pt and wife used a lot of diet culture buzz words "clean eating", "real food", "good food", "bad food", "chemicals". This RD discussed some myths and facts about food and food balance. Pt reports eating raisin bran with whole milk, either leftovers, whole grain sandwich with [place], or microwave meal (wife reports its a healthy one from the co-op), snacks are usually dry roasted/unsalted nuts or fruit, dinner is either chicken (red meat 2x/week) or plant protein like bean salad, whole grains, and vegetables. Pt and wife report eating mostly fresh foods and food from their garden. They choose healthy fats like olive oil and will use butter (but not as much). on weekends pt will have an egg with an english muffin. Pt wife reports cooking almost all  the food from scratch and using salt which she has tried to cut back. Disucssed HH eating, low sodium eating, and fiber. Pt reports he likes what he is eating.      Intervention Plan   Intervention  Prescribe, educate and counsel regarding individualized specific dietary modifications aiming towards targeted core components such as weight, hypertension, lipid management, diabetes, heart failure and other comorbidities.;Nutrition handout(s) given to patient.    Expected Outcomes  Short Term Goal: Understand basic principles of dietary content, such as calories, fat, sodium, cholesterol and nutrients.;Short Term Goal: A plan has been developed with personal nutrition goals set during dietitian appointment.;Long Term Goal: Adherence to prescribed  nutrition plan.       Nutrition Assessments: Nutrition Assessments - 02/27/19 1048      MEDFICTS Scores   Pre Score  32       Nutrition Goals Re-Evaluation: Nutrition Goals Re-Evaluation    Row Name 04/22/19 1131             Goals   Nutrition Goal  ST: take note of the ranges of Na you have daily  LT: have and improved EF       Comment  Continue with current changes       Expected Outcome  ST: take note of the ranges of Na you have daily  LT: have and improved EF          Nutrition Goals Discharge (Final Nutrition Goals Re-Evaluation): Nutrition Goals Re-Evaluation - 04/22/19 1131      Goals   Nutrition Goal  ST: take note of the ranges of Na you have daily  LT: have and improved EF    Comment  Continue with current changes    Expected Outcome  ST: take note of the ranges of Na you have daily  LT: have and improved EF       Psychosocial: Target Goals: Acknowledge presence or absence of significant depression and/or stress, maximize coping skills, provide positive support system. Participant is able to verbalize types and ability to use techniques and skills needed for reducing stress and depression.   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 He is glad to be rid of his LifeVest, but wanting to make sure his heart is okay. He and his wife are making lifestyle changes and he wants to get more education regarding his health's future.      Family Dynamics   Good Support System?  Yes      Barriers   Psychosocial barriers to participate in program  There are no identifiable barriers or psychosocial needs.;The patient should benefit from training in stress management and relaxation.      Screening Interventions   Interventions  Encouraged to exercise;To provide support  and resources with identified psychosocial needs;Provide feedback about the scores to participant    Expected Outcomes  Short Term goal: Utilizing psychosocial counselor, staff and physician to assist with identification of specific Stressors or current issues interfering with healing process. Setting desired goal for each stressor or current issue identified.;Long Term Goal: Stressors or current issues are controlled or eliminated.;Short Term goal: Identification and review with participant of any Quality of Life or Depression concerns found by scoring the questionnaire.;Long Term goal: The participant improves  quality of Life and PHQ9 Scores as seen by post scores and/or verbalization of changes       Quality of Life Scores:  Quality of Life - 02/27/19 1115      Quality of Life   Select  Quality of Life      Quality of Life Scores   Health/Function Pre  27.83 %    Socioeconomic Pre  28.07 %    Psych/Spiritual Pre  28.29 %    Family Pre  30 %    GLOBAL Pre  28.29 %      Scores of 19 and below usually indicate a poorer quality of life in these areas.  A difference of  2-3 points is a clinically meaningful difference.  A difference of 2-3 points in the total score of the Quality of Life Index has been associated with significant improvement in overall quality of life, self-image, physical symptoms, and general health in studies assessing change in quality of life.  PHQ-9: Recent Review Flowsheet Data    Depression screen [facility] 2/9 02/27/2019   Decreased Interest 0   Down, Depressed, Hopeless 0   PHQ - 2 Score 0   Altered sleeping 1   Tired, decreased energy 1   Change in appetite 0   Feeling bad or failure about yourself  0   Trouble concentrating 0   Moving slowly or fidgety/restless 0   Suicidal thoughts 0   PHQ-9 Score 2   Difficult doing work/chores Not difficult at all     Interpretation of Total Score  Total Score Depression Severity:  1-4 = Minimal depression, 5-9 = Mild  depression, 10-14 = Moderate depression, 15-19 = Moderately severe depression, 20-27 = Severe depression   Psychosocial Evaluation and Intervention:   Psychosocial Re-Evaluation: Psychosocial Re-Evaluation    [place] Name 03/13/19 0745 04/03/19 0735           Psychosocial Re-Evaluation   Current issues with  Current Sleep Concerns  Current Sleep Concerns      Comments  Aadith is doing well overall.  He is fatigued in afternoon after working 12 hour shifts.  He usually will sleep in 4 hour bouts and then wake and go back to sleep.  He used to work third shift and it still sticks with him.  Overall, he is doing well.  Senon is doing well overall. He has no major stressors.  He is still waking up in the early hours and tosses and turns for a bit.  He will go out to work in garden and then nap in afternoon to get enough sleep.  He is still having a hard time adjusting to his 12 hour days again at work.      Expected Outcomes  Short: Continue to try to sleep better and get his 6-7 hours.  Long: Continue to make time for self and exercise.  Short: Continue to try to sleep better and get his 6-7 hours.  Long: Continue to make time for self and exercise.      Interventions  Encouraged to attend Cardiac Rehabilitation for the exercise  Encouraged to attend Cardiac Rehabilitation for the exercise      Continue Psychosocial Services   Follow up required by staff  Follow up required by staff         Psychosocial Discharge (Final Psychosocial Re-Evaluation): Psychosocial Re-Evaluation - 04/03/19 0735      Psychosocial Re-Evaluation   Current issues with  Current Sleep Concerns    Comments  Elta [place]  is doing well overall. He has no major stressors.  He is still waking up in the early hours and tosses and turns for a bit.  He will go out to work in garden and then nap in afternoon to get enough sleep.  He is still having a hard time adjusting to his 12 hour days again at work.    Expected Outcomes  Short: Continue  to try to sleep better and get his 6-7 hours.  Long: Continue to make time for self and exercise.    Interventions  Encouraged to attend Cardiac Rehabilitation for the exercise    Continue Psychosocial Services   Follow up required by staff       Vocational Rehabilitation: Provide vocational rehab assistance to qualifying candidates.   Vocational Rehab Evaluation & Intervention: Vocational Rehab - 02/26/19 1039      Initial Vocational Rehab Evaluation & Intervention   Assessment shows need for Vocational Rehabilitation  No       Education: Education Goals: Education classes will be provided on a variety of topics geared toward better understanding of heart health and risk factor modification. Participant will state understanding/return demonstration of topics presented as noted by education test scores.  Learning Barriers/Preferences: Learning Barriers/Preferences - 02/26/19 1039      Learning Barriers/Preferences   Learning Barriers  None    Learning Preferences  None       Education Topics:  AED/CPR: - Group verbal and written instruction with the use of models to demonstrate the basic use of the AED with the basic ABC's of resuscitation.   General Nutrition Guidelines/Fats and Fiber: -Group instruction provided by verbal, written material, models and posters to present the general guidelines for heart healthy nutrition. Gives an explanation and review of dietary fats and fiber.   Controlling Sodium/Reading Food Labels: -Group verbal and written material supporting the discussion of sodium use in heart healthy nutrition. Review and explanation with models, verbal and written materials for utilization of the food label.   Exercise Physiology & General Exercise Guidelines: - Group verbal and written instruction with models to review the exercise physiology of the cardiovascular system and associated critical values. Provides general exercise guidelines with specific  guidelines to those with heart or lung disease.    Aerobic Exercise & Resistance Training: - Gives group verbal and written instruction on the various components of exercise. Focuses on aerobic and resistive training programs and the benefits of this training and how to safely progress through these programs..   Cardiac Rehab from 04/10/2019 in [facility] Cardiac and Pulmonary Rehab  Date  04/10/19  Educator  [facility]  Instruction Review Code  1- Verbalizes Understanding      Flexibility, Balance, Mind/Body Relaxation: Provides group verbal/written instruction on the benefits of flexibility and balance training, including mind/body exercise modes such as yoga, pilates and tai chi.  Demonstration and skill practice provided.   Stress and Anxiety: - Provides group verbal and written instruction about the health risks of elevated stress and causes of high stress.  Discuss the correlation between heart/lung disease and anxiety and treatment options. Review healthy ways to manage with stress and anxiety.   Depression: - Provides group verbal and written instruction on the correlation between heart/lung disease and depressed mood, treatment options, and the stigmas associated with seeking treatment.   Anatomy & Physiology of the Heart: - Group verbal and written instruction and models provide basic cardiac anatomy and physiology, with the coronary electrical and arterial systems. Review of Valvular  disease and Heart Failure   Cardiac Procedures: - Group verbal and written instruction to review commonly prescribed medications for heart disease. Reviews the medication, class of the drug, and side effects. Includes the steps to properly store meds and maintain the prescription regimen. (beta blockers and nitrates)   Cardiac Medications I: - Group verbal and written instruction to review commonly prescribed medications for heart disease. Reviews the medication, class of the drug, and side effects.  Includes the steps to properly store meds and maintain the prescription regimen.   Cardiac Medications II: -Group verbal and written instruction to review commonly prescribed medications for heart disease. Reviews the medication, class of the drug, and side effects. (all other drug classes)   Cardiac Rehab from 04/10/2019 in [facility] Cardiac and Pulmonary Rehab  Date  04/10/19  Educator  [facility]  Instruction Review Code  1- Verbalizes Understanding       Go Sex-Intimacy & Heart Disease, Get SMART - Goal Setting: - Group verbal and written instruction through game format to discuss heart disease and the return to sexual intimacy. Provides group verbal and written material to discuss and apply goal setting through the application of the S.M.A.R.T. Method.   Other Matters of the Heart: - Provides group verbal, written materials and models to describe Stable Angina and Peripheral Artery. Includes description of the disease process and treatment options available to the cardiac patient.   Exercise & Equipment Safety: - Individual verbal instruction and demonstration of equipment use and safety with use of the equipment.   Cardiac Rehab from 02/27/2019 in [facility] Cardiac and Pulmonary Rehab  Date  02/27/19  Educator  [facility]  Instruction Review Code  1- Verbalizes Understanding      Infection Prevention: - Provides verbal and written material to individual with discussion of infection control including proper hand washing and proper equipment cleaning during exercise session.   Cardiac Rehab from 02/27/2019 in [facility] Cardiac and Pulmonary Rehab  Date  02/27/19  Educator  [facility]  Instruction Review Code  1- Verbalizes Understanding      Falls Prevention: - Provides verbal and written material to individual with discussion of falls prevention and safety.   Cardiac Rehab from 02/27/2019 in [facility] Cardiac and Pulmonary Rehab  Date  02/27/19  Educator  [facility]  Instruction Review Code  1- Verbalizes Understanding       Diabetes: - Individual verbal and written instruction to review signs/symptoms of diabetes, desired ranges of glucose level fasting, after meals and with exercise. Acknowledge that pre and post exercise glucose checks will be done for 3 sessions at entry of program.   Know Your Numbers and Risk Factors: -Group verbal and written instruction about important numbers in your health.  Discussion of what are risk factors and how they play a role in the disease process.  Review of Cholesterol, Blood Pressure, Diabetes, and BMI and the role they play in your overall health.   Cardiac Rehab from 04/10/2019 in [facility] Cardiac and Pulmonary Rehab  Date  04/10/19  Educator  [facility]  Instruction Review Code  1- Verbalizes Understanding      Sleep Hygiene: -Provides group verbal and written instruction about how sleep can affect your health.  Define sleep hygiene, discuss sleep cycles and impact of sleep habits. Review good sleep hygiene tips.    Other: -Provides group and verbal instruction on various topics (see comments)   Knowledge Questionnaire Score:   Core Components/Risk Factors/Patient Goals at Admission: Personal Goals and Risk Factors at Admission -  02/27/19 1112      Core Components/Risk Factors/Patient Goals on Admission    Weight Management  Yes;Weight Loss    Intervention  Weight Management: Develop a combined nutrition and exercise program designed to reach desired caloric intake, while maintaining appropriate intake of nutrient and fiber, sodium and fats, and appropriate energy expenditure required for the weight goal.;Weight Management: Provide education and appropriate resources to help participant work on and attain dietary goals.    Admit Weight  202 lb 1.6 oz (91.7 kg)    Goal Weight: Short Term  195 lb (88.5 kg)    Goal Weight: Long Term  190 lb (86.2 kg)    Expected Outcomes  Short Term: Continue to assess and modify interventions until short term weight is achieved;Long  Term: Adherence to nutrition and physical activity/exercise program aimed toward attainment of established weight goal;Weight Loss: Understanding of general recommendations for a balanced deficit meal plan, which promotes 1-2 lb weight loss per week and includes a negative energy balance of [phone] kcal/d;Understanding recommendations for meals to include 15-35% energy as protein, 25-35% energy from fat, 35-60% energy from carbohydrates, less than 294m of dietary cholesterol, 20-35 gm of total fiber daily;Understanding of distribution of calorie intake throughout the day with the consumption of 4-5 meals/snacks    Heart Failure  Yes    Intervention  Provide a combined exercise and nutrition program that is supplemented with education, support and counseling about heart failure. Directed toward relieving symptoms such as shortness of breath, decreased exercise tolerance, and extremity edema.    Expected Outcomes  Improve functional capacity of life;Short term: Attendance in program 2-3 days a week with increased exercise capacity. Reported lower sodium intake. Reported increased fruit and vegetable intake. Reports medication compliance.;Short term: Daily weights obtained and reported for increase. Utilizing diuretic protocols set by physician.;Long term: Adoption of self-care skills and reduction of barriers for early signs and symptoms recognition and intervention leading to self-care maintenance.    Lipids  Yes    Intervention  Provide education and support for participant on nutrition & aerobic/resistive exercise along with prescribed medications to achieve LDL <769m HDL >4052m   Expected Outcomes  Short Term: Participant states understanding of desired cholesterol values and is compliant with medications prescribed. Participant is following exercise prescription and nutrition guidelines.;Long Term: Cholesterol controlled with medications as prescribed, with individualized exercise RX and with  personalized nutrition plan. Value goals: LDL < 19m73mDL > 40 mg.       Core Components/Risk Factors/Patient Goals Review:  Goals and Risk Factor Review    Row Name 03/13/19 0748 04/03/19 0744           Core Components/Risk Factors/Patient Goals Review   Personal Goals Review  Weight Management/Obesity;Hypertension;Heart Failure;Lipids  Weight Management/Obesity;Hypertension;Heart Failure;Lipids      Review  MarkKielandoing well with his weight. He checks it in class as he doesn't check it at home as he doesn't have a scale.  He will let us kKoreaw about the scale.  Reviewed heart failure action plan with him today.  He is watching his sodium levels.  His pressures have been good in class as he does not have a cuff.  He will look into it.  We can into foundation for providing these to him.  He is doing well with his medicaitons.  MarkMarilynstaying steady with his weight around 202 lbs.  He still has not gotten a scale or cuff so he only checks them here.  His pressures have been good in class.  He has not had any heart failure symptoms.  We will look into foundation for help with scale and cuff to heart failure management.      Expected Outcomes  Short: Look into getting scale and cuff for home use.  Long: Continue to monitor risk factors.  Short: talk to foundation.  Long: Continue to montior heart failure.         Core Components/Risk Factors/Patient Goals at Discharge (Final Review):  Goals and Risk Factor Review - 04/03/19 0744      Core Components/Risk Factors/Patient Goals Review   Personal Goals Review  Weight Management/Obesity;Hypertension;Heart Failure;Lipids    Review  Avyn is staying steady with his weight around 202 lbs.  He still has not gotten a scale or cuff so he only checks them here.  His pressures have been good in class.  He has not had any heart failure symptoms.  We will look into foundation for help with scale and cuff to heart failure management.    Expected Outcomes   Short: talk to foundation.  Long: Continue to montior heart failure.       ITP Comments: ITP Comments    Row Name 02/26/19 1043 02/27/19 1052 03/26/19 1311 04/23/19 0634     ITP Comments  Virtual initial orientation completed. Diagnosis can be found in CE 8/26. EP/RD orientation scheduled for 9/10 at 9:30  Completed 6MWT, gym orientation, and RD evaluation. Initial ITP created and sent for review to Dr. Emily Filbert, Medical Director.  30 day review completed. ITP sent to Dr. Emily Filbert, Medical Director of Cardiac and Pulmonary Rehab. Continue with ITP unless changes are made by physician.  Department closed starting 10/2 until further notice by infection prevention and Health at Work teams for COVID-19.  30 day review completed. Continue with ITP sent to Dr. Emily Filbert, Medical Director of Cardiac and Pulmonary Rehab for review , changes as needed and signature.       Comments:

## 2019-04-24 ENCOUNTER — Other Ambulatory Visit: Payer: Self-pay

## 2019-04-24 ENCOUNTER — Encounter: Payer: PRIVATE HEALTH INSURANCE | Admitting: *Deleted

## 2019-04-24 DIAGNOSIS — Z952 Presence of prosthetic heart valve: Secondary | ICD-10-CM | POA: Diagnosis not present

## 2019-04-24 NOTE — Progress Notes (Signed)
Daily Session Note  Patient Details  Name: Brandon Kennedy MRN: [id number] Date of Birth: 04-May-1961 Referring Provider:     Cardiac Rehab from 02/27/2019 in [facility] Cardiac and Pulmonary Rehab  Referring Provider  Marisa Severin MD      Encounter Date: 04/24/2019  Check In: Session Check In - 04/24/19 0742      Check-In   Supervising physician immediately available to respond to emergencies  See telemetry face sheet for immediately available ER MD    Location  [facility]-Cardiac & Pulmonary Rehab    Staff Present  Heath Lark, RN, BSN, CCRP;Amanda Sommer, BA, ACSM CEP, Exercise Physiologist;Jessica [place], MA, RCEP, CCRP, CCET    Virtual Visit  No    Medication changes reported      No    Fall or balance concerns reported     No    Warm-up and Cool-down  Performed on first and last piece of equipment    Resistance Training Performed  Yes    VAD Patient?  No    PAD/SET Patient?  No      Pain Assessment   Currently in Pain?  No/denies          Social History   Tobacco Use  Smoking Status Never Smoker  Smokeless Tobacco Never Used    Goals Met:  Independence with exercise equipment Exercise tolerated well No report of cardiac concerns or symptoms  Goals Unmet:  Not Applicable  Comments: Pt able to follow exercise prescription today without complaint.  Will continue to monitor for progression.    Dr. Emily Filbert is Medical Director for [place] and [facility].

## 2019-04-29 ENCOUNTER — Encounter: Payer: PRIVATE HEALTH INSURANCE | Admitting: *Deleted

## 2019-04-29 ENCOUNTER — Other Ambulatory Visit: Payer: Self-pay

## 2019-04-29 DIAGNOSIS — Z952 Presence of prosthetic heart valve: Secondary | ICD-10-CM

## 2019-04-29 NOTE — Progress Notes (Signed)
Daily Session Note  Patient Details  Name: Brandon Kennedy MRN: [id number] Date of Birth: 01-03-1961 Referring Provider:     Cardiac Rehab from 02/27/2019 in [facility] Cardiac and Pulmonary Rehab  Referring Provider  Marisa Severin MD      Encounter Date: 04/29/2019  Check In: Session Check In - 04/29/19 0802      Check-In   Supervising physician immediately available to respond to emergencies  See telemetry face sheet for immediately available ER MD    Location  [facility]-Cardiac & Pulmonary Rehab    Staff Present  Heath Lark, RN, BSN, CCRP;Joseph Hood RCP,RRT,BSRT;Jessica Dietrich, [place], [place], [place], CCET    Virtual Visit  No    Medication changes reported      No    Fall or balance concerns reported     No    Warm-up and Cool-down  Performed on first and last piece of equipment    Resistance Training Performed  Yes    VAD Patient?  No    PAD/SET Patient?  No      Pain Assessment   Currently in Pain?  No/denies          Social History   Tobacco Use  Smoking Status Never Smoker  Smokeless Tobacco Never Used    Goals Met:  Independence with exercise equipment Exercise tolerated well No report of cardiac concerns or symptoms  Goals Unmet:  Not Applicable  Comments: Pt able to follow exercise prescription today without complaint.  Will continue to monitor for progression.    Dr. Emily Filbert is Medical Director for [place] and [facility].

## 2019-05-01 ENCOUNTER — Encounter: Payer: PRIVATE HEALTH INSURANCE | Admitting: *Deleted

## 2019-05-01 ENCOUNTER — Other Ambulatory Visit: Payer: Self-pay

## 2019-05-01 DIAGNOSIS — Z952 Presence of prosthetic heart valve: Secondary | ICD-10-CM | POA: Diagnosis not present

## 2019-05-01 NOTE — Progress Notes (Signed)
Daily Session Note  Patient Details  Name: Brandon Kennedy MRN: [id number] Date of Birth: 09-04-1960 Referring Provider:     Cardiac Rehab from 02/27/2019 in [facility] Cardiac and Pulmonary Rehab  Referring Provider  Marisa Severin MD      Encounter Date: 05/01/2019  Check In: Session Check In - 05/01/19 0802      Check-In   Supervising physician immediately available to respond to emergencies  See telemetry face sheet for immediately available ER MD    Location  [facility]-Cardiac & Pulmonary Rehab    Staff Present  Heath Lark, RN, BSN, CCRP;Jessica [place], MA, RCEP, CCRP, CCET    Virtual Visit  No    Medication changes reported      No    Fall or balance concerns reported     No    Warm-up and Cool-down  Performed on first and last piece of equipment    Resistance Training Performed  Yes    VAD Patient?  No    PAD/SET Patient?  No      VAD patient   Has back up controller?  No      Pain Assessment   Currently in Pain?  No/denies          Social History   Tobacco Use  Smoking Status Never Smoker  Smokeless Tobacco Never Used    Goals Met:  Independence with exercise equipment Exercise tolerated well No report of cardiac concerns or symptoms  Goals Unmet:  Not Applicable  Comments: Pt able to follow exercise prescription today without complaint.  Will continue to monitor for progression.    Dr. Emily Filbert is Medical Director for [place] and [facility].

## 2019-05-06 ENCOUNTER — Other Ambulatory Visit: Payer: Self-pay

## 2019-05-06 ENCOUNTER — Encounter: Payer: PRIVATE HEALTH INSURANCE | Admitting: *Deleted

## 2019-05-06 DIAGNOSIS — Z952 Presence of prosthetic heart valve: Secondary | ICD-10-CM

## 2019-05-06 NOTE — Progress Notes (Signed)
Daily Session Note  Patient Details  Name: Brandon Kennedy MRN: [id number] Date of Birth: June 16, 1961 Referring Provider:     Cardiac Rehab from 02/27/2019 in [facility] Cardiac and Pulmonary Rehab  Referring Provider  Marisa Severin MD      Encounter Date: 05/06/2019  Check In: Session Check In - 05/06/19 0810      Check-In   Supervising physician immediately available to respond to emergencies  See telemetry face sheet for immediately available ER MD    Location  [facility]-Cardiac & Pulmonary Rehab    Staff Present  Heath Lark, RN, BSN, CCRP;Jessica [place], MA, RCEP, CCRP, [place], [place], ACSM CEP, Exercise Physiologist    Virtual Visit  No    Medication changes reported      No    Fall or balance concerns reported     No    Warm-up and Cool-down  Performed on first and last piece of equipment    Resistance Training Performed  Yes    VAD Patient?  No    PAD/SET Patient?  No      Pain Assessment   Currently in Pain?  No/denies          Social History   Tobacco Use  Smoking Status Never Smoker  Smokeless Tobacco Never Used    Goals Met:  Independence with exercise equipment Exercise tolerated well No report of cardiac concerns or symptoms  Goals Unmet:  Not Applicable  Comments: Pt able to follow exercise prescription today without complaint.  Will continue to monitor for progression.    Dr. Emily Filbert is Medical Director for [place] and [facility].

## 2019-05-08 ENCOUNTER — Other Ambulatory Visit: Payer: Self-pay

## 2019-05-08 ENCOUNTER — Encounter: Payer: PRIVATE HEALTH INSURANCE | Admitting: *Deleted

## 2019-05-08 DIAGNOSIS — Z952 Presence of prosthetic heart valve: Secondary | ICD-10-CM

## 2019-05-08 NOTE — Progress Notes (Signed)
Daily Session Note  Patient Details  Name: Brandon Kennedy MRN: [id number] Date of Birth: [birth year] Referring Provider:     Cardiac Rehab from 02/27/2019 in [facility] Cardiac and Pulmonary Rehab  Referring Provider  Marisa Severin MD      Encounter Date: 05/08/2019  Check In: Session Check In - 05/08/19 0802      Check-In   Supervising physician immediately available to respond to emergencies  See telemetry face sheet for immediately available ER MD    Location  [facility]-Cardiac & Pulmonary Rehab    Staff Present  Heath Lark, RN, BSN, CCRP;Laureen Owens Shark, BS, RRT, CPFT;Amanda Oletta Darter, BA, ACSM CEP, Exercise Physiologist    Virtual Visit  No    Medication changes reported      No    Fall or balance concerns reported     No    Warm-up and Cool-down  Performed on first and last piece of equipment    Resistance Training Performed  Yes    VAD Patient?  No    PAD/SET Patient?  No      VAD patient   Has back up controller?  No          Social History   Tobacco Use  Smoking Status Never Smoker  Smokeless Tobacco Never Used    Goals Met:  Independence with exercise equipment Exercise tolerated well No report of cardiac concerns or symptoms  Goals Unmet:  Not Applicable  Comments: Pt able to follow exercise prescription today without complaint.  Will continue to monitor for progression.    Dr. Emily Filbert is Medical Director for [place] and [facility].

## 2019-05-13 ENCOUNTER — Encounter: Payer: PRIVATE HEALTH INSURANCE | Admitting: *Deleted

## 2019-05-13 ENCOUNTER — Other Ambulatory Visit: Payer: Self-pay

## 2019-05-13 DIAGNOSIS — Z952 Presence of prosthetic heart valve: Secondary | ICD-10-CM | POA: Diagnosis not present

## 2019-05-13 NOTE — Progress Notes (Signed)
Daily Session Note  Patient Details  Name: Brandon Kennedy MRN: [id number] Date of Birth: Jan 22, 1961 Referring Provider:     Cardiac Rehab from 02/27/2019 in [facility] Cardiac and Pulmonary Rehab  Referring Provider  Marisa Severin MD      Encounter Date: 05/13/2019  Check In: Session Check In - 05/13/19 0750      Check-In   Supervising physician immediately available to respond to emergencies  See telemetry face sheet for immediately available ER MD    Location  [facility]-Cardiac & Pulmonary Rehab    Staff Present  Heath Lark, RN, BSN, CCRP;Jessica [place], MA, RCEP, CCRP, [place], [place], ACSM CEP, Exercise Physiologist    Virtual Visit  No    Medication changes reported      No    Fall or balance concerns reported     No    Warm-up and Cool-down  Performed on first and last piece of equipment    Resistance Training Performed  Yes    VAD Patient?  No    PAD/SET Patient?  No      VAD patient   Has back up controller?  No      Pain Assessment   Currently in Pain?  No/denies          Social History   Tobacco Use  Smoking Status Never Smoker  Smokeless Tobacco Never Used    Goals Met:  Independence with exercise equipment Exercise tolerated well No report of cardiac concerns or symptoms  Goals Unmet:  Not Applicable  Comments: Pt able to follow exercise prescription today without complaint.  Will continue to monitor for progression.    Dr. Emily Filbert is Medical Director for [place] and [facility].

## 2019-05-20 ENCOUNTER — Other Ambulatory Visit: Payer: Self-pay

## 2019-05-20 ENCOUNTER — Encounter: Payer: PRIVATE HEALTH INSURANCE | Attending: Internal Medicine | Admitting: *Deleted

## 2019-05-20 DIAGNOSIS — Z952 Presence of prosthetic heart valve: Secondary | ICD-10-CM

## 2019-05-20 NOTE — Progress Notes (Signed)
Daily Session Note  Patient Details  Name: Brandon Kennedy MRN: [id number] Date of Birth: 09/22/1960 Referring Provider:     Cardiac Rehab from 02/27/2019 in [facility] Cardiac and Pulmonary Rehab  Referring Provider  Vavalle, John MD      Encounter Date: 05/20/2019  Check In: Session Check In - 05/20/19 0818      Check-In   Supervising physician immediately available to respond to emergencies  See telemetry face sheet for immediately available ER MD    Location  [facility]-Cardiac & Pulmonary Rehab    Staff Present  Susanne Bice, RN, BSN, CCRP;Jessica [place], MA, RCEP, CCRP, CCET;Joseph Hood RCP,RRT,BSRT    Virtual Visit  No    Medication changes reported      No    Fall or balance concerns reported     No    Warm-up and Cool-down  Performed on first and last piece of equipment    Resistance Training Performed  Yes    VAD Patient?  No    PAD/SET Patient?  No      VAD patient   Has back up controller?  No      Pain Assessment   Currently in Pain?  No/denies          Social History   Tobacco Use  Smoking Status Never Smoker  Smokeless Tobacco Never Used    Goals Met:  Independence with exercise equipment Exercise tolerated well No report of cardiac concerns or symptoms  Goals Unmet:  Not Applicable  Comments: Pt able to follow exercise prescription today without complaint.  Will continue to monitor for progression.    Dr. Takoda Miller is Medical Director for [facility] and [facility]. 

## 2019-05-21 ENCOUNTER — Encounter: Payer: Self-pay | Admitting: *Deleted

## 2019-05-21 DIAGNOSIS — Z952 Presence of prosthetic heart valve: Secondary | ICD-10-CM

## 2019-05-21 NOTE — Progress Notes (Signed)
Cardiac Individual Treatment Plan  Patient Details  Name: Brandon Kennedy MRN: [id number] Date of Birth: 01-27-1961 Referring Provider:     Cardiac Rehab from 02/27/2019 in [facility] Cardiac and Pulmonary Rehab  Referring Provider  Marisa Severin MD      Initial Encounter Date:    Cardiac Rehab from 02/27/2019 in [facility] Cardiac and Pulmonary Rehab  Date  02/27/19      Visit Diagnosis: S/P TAVR (transcatheter aortic valve replacement)  Patient's Home Medications on Admission:  Current Outpatient Medications:  .  acetaminophen (TYLENOL) 500 MG tablet, Take by mouth., Disp: , Rfl:  .  aspirin EC 81 MG tablet, Take by mouth., Disp: , Rfl:  .  losartan (COZAAR) 25 MG tablet, Take by mouth., Disp: , Rfl:  .  metoprolol succinate (TOPROL-XL) 25 MG 24 hr tablet, Take by mouth., Disp: , Rfl:   Past Medical History: No past medical history on file.  Tobacco Use: Social History   Tobacco Use  Smoking Status Never Smoker  Smokeless Tobacco Never Used    Labs: Recent Review Flowsheet Data    There is no flowsheet data to display.       Exercise Target Goals: Exercise Program Goal: Individual exercise prescription set using results from initial 6 min walk test and THRR while considering  patient's activity barriers and safety.   Exercise Prescription Goal: Initial exercise prescription builds to 30-45 minutes a day of aerobic activity, 2-3 days per week.  Home exercise guidelines will be given to patient during program as part of exercise prescription that the participant will acknowledge.  Activity Barriers & Risk Stratification: Activity Barriers & Cardiac Risk Stratification - 02/27/19 1054      Activity Barriers & Cardiac Risk Stratification   Activity Barriers  Arthritis;Joint Problems;Decreased Ventricular Function;Muscular Weakness   bilateral knee pain   Cardiac Risk Stratification  Low       6 Minute Walk: 6 Minute Walk    Row Name 02/27/19 1052         6 Minute  Walk   Phase  Initial     Distance  1806 feet     Walk Time  6 minutes     # of Rest Breaks  0     MPH  3.42     METS  5.13     RPE  14     VO2 Peak  17.97     Symptoms  No     Resting HR  85 bpm     Resting BP  142/86     Resting Oxygen Saturation   98 %     Exercise Oxygen Saturation  during 6 min walk  99 %     Max Ex. HR  128 bpm     Max Ex. BP  184/74 recheck BP prior to resistance training 164/72     2 Minute Post BP  142/74        Oxygen Initial Assessment:   Oxygen Re-Evaluation:   Oxygen Discharge (Final Oxygen Re-Evaluation):   Initial Exercise Prescription: Initial Exercise Prescription - 02/27/19 1000      Date of Initial Exercise RX and Referring Provider   Date  02/27/19    Referring Provider  Marisa Severin MD      Treadmill   MPH  3.3    Grade  1    Minutes  15    METs  3.71      REL-XR   Level  3  Speed  50    Minutes  15    METs  3.5      T5 Nustep   Level  4    SPM  80    Minutes  15    METs  3.5      Prescription Details   Frequency (times per week)  2    Duration  Progress to 30 minutes of continuous aerobic without signs/symptoms of physical distress      Intensity   THRR 40-80% of Max Heartrate  115-145    Ratings of Perceived Exertion  11-13    Perceived Dyspnea  0-4      Progression   Progression  Continue to progress workloads to maintain intensity without signs/symptoms of physical distress.      Resistance Training   Training Prescription  Yes    Weight  4 lbs    Reps  10-15       Perform Capillary Blood Glucose checks as needed.  Exercise Prescription Changes: Exercise Prescription Changes    Row Name 02/27/19 1100 03/07/19 0700 03/11/19 1400 03/19/19 1400 04/02/19 1000     Response to Exercise   Blood Pressure (Admit)  142/86  128/64  -  94/60  126/66   Blood Pressure (Exercise)  184/74 rck 164/72  146/62  -  140/78  144/64   Blood Pressure (Exit)  142/74  124/70  -  94/56  118/64   Heart Rate (Admit)   85 bpm  68 bpm  -  68 bpm  63 bpm   Heart Rate (Exercise)  128 bpm  127 bpm  -  107 bpm  102 bpm   Heart Rate (Exit)  92 bpm  -  -  71 bpm  83 bpm   Oxygen Saturation (Admit)  98 %  -  -  -  -   Oxygen Saturation (Exercise)  99 %  -  -  -  -   Rating of Perceived Exertion (Exercise)  14  13  -  13  13   Symptoms  none  none  -  none  none   Comments  walk test results  -  -  -  -   Duration  -  Continue with 30 min of aerobic exercise without signs/symptoms of physical distress.  -  Continue with 30 min of aerobic exercise without signs/symptoms of physical distress.  Continue with 30 min of aerobic exercise without signs/symptoms of physical distress.   Intensity  -  THRR unchanged  -  THRR unchanged  THRR unchanged     Progression   Progression  -  -  -  Continue to progress workloads to maintain intensity without signs/symptoms of physical distress.  Continue to progress workloads to maintain intensity without signs/symptoms of physical distress.   Average METs  -  -  -  3.69  3.33     Resistance Training   Training Prescription  -  Yes  -  Yes  Yes   Weight  -  4 lb  -  5 lbs  5 lbs   Reps  -  10-15  -  10-15  10-15     Interval Training   Interval Training  -  No  -  -  No     Treadmill   MPH  -  3  -  3.3  3.3   Grade  -  1  -  1  1   Minutes  -  15  -  15  15   METs  -  3.71  -  3.98  3.98     REL-XR   Level  -  3  -  8  8   Speed  -  50  -  -  -   Minutes  -  15  -  15  15   METs  -  2.8  -  4.8  3.1     T5 Nustep   Level  -  4  -  5  5   SPM  -  80  -  -  -   Minutes  -  15  -  15  15   METs  -  2.7  -  2.3  2.9     Home Exercise Plan   Plans to continue exercise at  -  -  Home (comment)  Home (comment)  Home (comment)   Frequency  -  -  Add 3 additional days to program exercise sessions.  Add 3 additional days to program exercise sessions.  Add 3 additional days to program exercise sessions.   Initial Home Exercises Provided  -  -  03/11/19  03/11/19  03/11/19    Row Name 04/15/19 1400 04/30/19 1200 05/13/19 0900         Response to Exercise   Blood Pressure (Admit)  110/60  122/54  126/58     Blood Pressure (Exercise)  142/72  132/62  150/64     Blood Pressure (Exit)  104/64  130/64  128/74     Heart Rate (Admit)  57 bpm  65 bpm  66 bpm     Heart Rate (Exercise)  123 bpm  117 bpm  123 bpm     Heart Rate (Exit)  72 bpm  77 bpm  82 bpm     Rating of Perceived Exertion (Exercise)  '13  15  15     '$ Symptoms  none  none  none     Duration  Continue with 30 min of aerobic exercise without signs/symptoms of physical distress.  Continue with 30 min of aerobic exercise without signs/symptoms of physical distress.  Continue with 30 min of aerobic exercise without signs/symptoms of physical distress.     Intensity  THRR unchanged  THRR unchanged  THRR unchanged       Progression   Progression  Continue to progress workloads to maintain intensity without signs/symptoms of physical distress.  Continue to progress workloads to maintain intensity without signs/symptoms of physical distress.  Continue to progress workloads to maintain intensity without signs/symptoms of physical distress.     Average METs  3.6  5.87  4.7       Resistance Training   Training Prescription  Yes  Yes  Yes     Weight  5 lb  7 lb  7 lb     Reps  10-15  10-15  10-15       Interval Training   Interval Training  No  Yes  Yes     Equipment  -  Treadmill;REL-XR;T5 Nustep  Treadmill;Recumbant Elliptical;T5 Nustep     Comments  -  2 min off 30 sec on (1:1 on TM)  2 mon off 30 sec on       Treadmill   MPH  3.3  3.3  -     Grade  1  7  -     Minutes  15  15  -  METs  3.98  6.4  -       REL-XR   Level  '8  10  10     '$ Minutes  '15  15  15     '$ METs  4.8  7.4  6.7       T5 Nustep   Level  '6  6  7     '$ Minutes  '15  15  15     '$ METs  2.4  3.5  2.7       Home Exercise Plan   Plans to continue exercise at  Home (comment)  Home (comment)  Home (comment)     Frequency  Add 3 additional  days to program exercise sessions.  Add 3 additional days to program exercise sessions.  Add 3 additional days to program exercise sessions.     Initial Home Exercises Provided  -  03/11/19  03/11/19        Exercise Comments: Exercise Comments    Row Name 03/04/19 0800           Exercise Comments  First full day of exercise!  Patient was oriented to gym and equipment including functions, settings, policies, and procedures.  Patient's individual exercise prescription and treatment plan were reviewed.  All starting workloads were established based on the results of the 6 minute walk test done at initial orientation visit.  The plan for exercise progression was also introduced and progression will be customized based on patient's performance and goals.          Exercise Goals and Review: Exercise Goals    Row Name 02/27/19 1109             Exercise Goals   Increase Physical Activity  Yes       Intervention  Provide advice, education, support and counseling about physical activity/exercise needs.;Develop an individualized exercise prescription for aerobic and resistive training based on initial evaluation findings, risk stratification, comorbidities and participant's personal goals.       Expected Outcomes  Short Term: Attend rehab on a regular basis to increase amount of physical activity.;Long Term: Add in home exercise to make exercise part of routine and to increase amount of physical activity.;Long Term: Exercising regularly at least 3-5 days a week.       Increase Strength and Stamina  Yes       Intervention  Provide advice, education, support and counseling about physical activity/exercise needs.;Develop an individualized exercise prescription for aerobic and resistive training based on initial evaluation findings, risk stratification, comorbidities and participant's personal goals.       Expected Outcomes  Short Term: Increase workloads from initial exercise prescription for  resistance, speed, and METs.;Short Term: Perform resistance training exercises routinely during rehab and add in resistance training at home;Long Term: Improve cardiorespiratory fitness, muscular endurance and strength as measured by increased METs and functional capacity (6MWT)       Able to understand and use rate of perceived exertion (RPE) scale  Yes       Intervention  Provide education and explanation on how to use RPE scale       Expected Outcomes  Short Term: Able to use RPE daily in rehab to express subjective intensity level;Long Term:  Able to use RPE to guide intensity level when exercising independently       Knowledge and understanding of Target Heart Rate Range (THRR)  Yes       Intervention  Provide education and explanation of THRR including  how the numbers were predicted and where they are located for reference       Expected Outcomes  Short Term: Able to state/look up THRR;Short Term: Able to use daily as guideline for intensity in rehab;Long Term: Able to use THRR to govern intensity when exercising independently       Able to check pulse independently  Yes       Intervention  Provide education and demonstration on how to check pulse in carotid and radial arteries.;Review the importance of being able to check your own pulse for safety during independent exercise       Expected Outcomes  Short Term: Able to explain why pulse checking is important during independent exercise;Long Term: Able to check pulse independently and accurately       Understanding of Exercise Prescription  Yes       Intervention  Provide education, explanation, and written materials on patient's individual exercise prescription       Expected Outcomes  Long Term: Able to explain home exercise prescription to exercise independently;Short Term: Able to explain program exercise prescription          Exercise Goals Re-Evaluation : Exercise Goals Re-Evaluation    [place] Name 03/04/19 0800 03/07/19 0745 03/13/19 0740  03/18/19 1520 04/01/19 1545     Exercise Goal Re-Evaluation   Exercise Goals Review  Increase Physical Activity;Increase Strength and Stamina;Able to understand and use rate of perceived exertion (RPE) scale;Able to understand and use Dyspnea scale;Knowledge and understanding of Target Heart Rate Range (THRR);Able to check pulse independently;Understanding of Exercise Prescription  Increase Physical Activity;Increase Strength and Stamina;Able to understand and use rate of perceived exertion (RPE) scale;Able to understand and use Dyspnea scale;Knowledge and understanding of Target Heart Rate Range (THRR);Able to check pulse independently;Understanding of Exercise Prescription  Increase Physical Activity;Increase Strength and Stamina;Understanding of Exercise Prescription  Increase Physical Activity;Increase Strength and Stamina;Understanding of Exercise Prescription  Increase Physical Activity;Increase Strength and Stamina;Understanding of Exercise Prescription   Comments  Reviewed RPE scale, THR and program prescription with pt today.  Pt voiced understanding and was given a copy of goals to take home.  Shean is tolerating exercise well.  Staff will continue to monitor progress.  Rustyn is doing well in rehab. He is off to a good start and already walking at home.  He is starting to recover his strength and stamina.  He is not doing anything too strenuous yet.  He is fatigued after work for standing and walking for 12 hours.  He naps in the afternoons.  Renald continues to do well in rehab.  He is doing well with his workloads and should be ready to start to move them up!!  We will continue to monitor his progress.  Ejay has been doing well in rehab.  He has increased almost all of his workloads!  We will continue to monitor his progression.   Expected Outcomes  Short: Use RPE daily to regulate intensity. Long: Follow program prescription in THR.  Short - attend consistently Long - increase overall MET level  Short:  Continue to walk on off days.  Long: Continue to increase stamina.  Short: Begin to increase workloads.  Long: Continue to improve stamina.  Short: Review home exercise guidelines.  Long: Continue to improve stamina.   [place] Name 04/03/19 6384 04/15/19 1406 04/29/19 0807 05/13/19 0930       Exercise Goal Re-Evaluation   Exercise Goals Review  Increase Physical Activity;Increase Strength and Stamina;Understanding of Exercise Prescription  Increase Physical Activity;Increase Strength and Stamina;Able to understand and use rate of perceived exertion (RPE) scale;Knowledge and understanding of Target Heart Rate Range (THRR);Able to check pulse independently;Understanding of Exercise Prescription  Increase Physical Activity;Increase Strength and Stamina;Knowledge and understanding of Target Heart Rate Range (THRR)  Increase Physical Activity;Increase Strength and Stamina;Able to understand and use rate of perceived exertion (RPE) scale;Able to understand and use Dyspnea scale;Knowledge and understanding of Target Heart Rate Range (THRR);Able to check pulse independently;Understanding of Exercise Prescription    Comments  Tanveer continues to do well in rehab. He is working hard in class and exercising at home. He also stays active by gardening.  He has noticed that he is now able to do more in his garden without getting as short of breath as during the summer.  Jon is continuing to attend consistently and work in correct THR and RPE range.  He also exercsises at home.  Altariq works 3 days a week for 12 hours shifts. He walks at home and does weights twice a week. He does not exercise on the weekend but he is walking all day long. Sometimes when he is out gardening he over does it but feels like he can do alot more than he used to. He knows his target heart rate and knows how to check it manually.  Jahn does intervals during cardio sessions and has increased overall MET level.  Staff will monitor progress.    Expected  Outcomes  Short: Continue to exericse at home on off day.  Long: Continue to work on stamina in garden.  Short - continue to be consistent and increase workloads Long - increase overall MET level  Short: continue to exercise. Long: graduate [place].  Short - continue interval training Long : complete HT       Discharge Exercise Prescription (Final Exercise Prescription Changes): Exercise Prescription Changes - 05/13/19 0900      Response to Exercise   Blood Pressure (Admit)  126/58    Blood Pressure (Exercise)  150/64    Blood Pressure (Exit)  128/74    Heart Rate (Admit)  66 bpm    Heart Rate (Exercise)  123 bpm    Heart Rate (Exit)  82 bpm    Rating of Perceived Exertion (Exercise)  15    Symptoms  none    Duration  Continue with 30 min of aerobic exercise without signs/symptoms of physical distress.    Intensity  THRR unchanged      Progression   Progression  Continue to progress workloads to maintain intensity without signs/symptoms of physical distress.    Average METs  4.7      Resistance Training   Training Prescription  Yes    Weight  7 lb    Reps  10-15      Interval Training   Interval Training  Yes    Equipment  Treadmill;Recumbant Elliptical;T5 Nustep    Comments  2 mon off 30 sec on      REL-XR   Level  10    Minutes  15    METs  6.7      T5 Nustep   Level  7    Minutes  15    METs  2.7      Home Exercise Plan   Plans to continue exercise at  Home (comment)    Frequency  Add 3 additional days to program exercise sessions.    Initial Home Exercises Provided  03/11/19  Nutrition:  Target Goals: Understanding of nutrition guidelines, daily intake of sodium '1500mg'$ , cholesterol '200mg'$ , calories 30% from fat and 7% or less from saturated fats, daily to have 5 or more servings of fruits and vegetables.  Biometrics: Pre Biometrics - 02/27/19 1112      Pre Biometrics   Height  5' 11.2" (1.808 m)    Weight  202 lb 1.6 oz (91.7 kg)    BMI  (Calculated)  28.04    Single Leg Stand  30 seconds        Nutrition Therapy Plan and Nutrition Goals: Nutrition Therapy & Goals - 02/27/19 1048      Nutrition Therapy   Diet  low Na, HH diet    Protein (specify units)  95g    Fiber  30 grams    Whole Grain Foods  3 servings    Saturated Fats  12 max. grams    Fruits and Vegetables  5 servings/day    Sodium  1.5 grams      Personal Nutrition Goals   Nutrition Goal  ST: take note of the ranges of Na you have daily  LT: have and improved EF    Comments  Pt and wife used a lot of diet culture buzz words "clean eating", "real food", "good food", "bad food", "chemicals". This RD discussed some myths and facts about food and food balance. Pt reports eating raisin bran with whole milk, either leftovers, whole grain sandwich with [place], or microwave meal (wife reports its a healthy one from the co-op), snacks are usually dry roasted/unsalted nuts or fruit, dinner is either chicken (red meat 2x/week) or plant protein like bean salad, whole grains, and vegetables. Pt and wife report eating mostly fresh foods and food from their garden. They choose healthy fats like olive oil and will use butter (but not as much). on weekends pt will have an egg with an english muffin. Pt wife reports cooking almost all the food from scratch and using salt which she has tried to cut back. Disucssed HH eating, low sodium eating, and fiber. Pt reports he likes what he is eating.      Intervention Plan   Intervention  Prescribe, educate and counsel regarding individualized specific dietary modifications aiming towards targeted core components such as weight, hypertension, lipid management, diabetes, heart failure and other comorbidities.;Nutrition handout(s) given to patient.    Expected Outcomes  Short Term Goal: Understand basic principles of dietary content, such as calories, fat, sodium, cholesterol and nutrients.;Short Term Goal: A plan has been developed with  personal nutrition goals set during dietitian appointment.;Long Term Goal: Adherence to prescribed nutrition plan.       Nutrition Assessments: Nutrition Assessments - 02/27/19 1048      MEDFICTS Scores   Pre Score  32       Nutrition Goals Re-Evaluation: Nutrition Goals Re-Evaluation    Row Name 04/22/19 1131 05/13/19 7654           Goals   Nutrition Goal  ST: take note of the ranges of Na you have daily  LT: have and improved EF  ST: Be mindful of hunger scale LT: have improved EF      Comment  Continue with current changes  more aware of sodium, now uses lower salt in cooking. Talked about frozen meals over the weekend with wife. Thinks he eats too much on the weekend. bean and quinoa salads. using spices rather than salt. wife made broccoli salad.  Expected Outcome  ST: take note of the ranges of Na you have daily  LT: have and improved EF  ST: Be mindful of hunger scale LT: have improved EF         Nutrition Goals Discharge (Final Nutrition Goals Re-Evaluation): Nutrition Goals Re-Evaluation - 05/13/19 3016      Goals   Nutrition Goal  ST: Be mindful of hunger scale LT: have improved EF    Comment  more aware of sodium, now uses lower salt in cooking. Talked about frozen meals over the weekend with wife. Thinks he eats too much on the weekend. bean and quinoa salads. using spices rather than salt. wife made broccoli salad.    Expected Outcome  ST: Be mindful of hunger scale LT: have improved EF       Psychosocial: Target Goals: Acknowledge presence or absence of significant depression and/or stress, maximize coping skills, provide positive support system. Participant is able to verbalize types and ability to use techniques and skills needed for reducing stress and depression.   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 He is glad to be rid of his LifeVest, but wanting to make sure his heart is okay. He and his wife are making lifestyle changes and he wants to get more education regarding his health's future.      Family Dynamics   Good Support System?  Yes      Barriers   Psychosocial barriers to participate in program  There are no identifiable barriers or psychosocial needs.;The patient should benefit from training in stress management and relaxation.      Screening Interventions   Interventions  Encouraged to exercise;To provide support and resources with identified psychosocial needs;Provide feedback about the scores to participant    Expected Outcomes  Short Term goal: Utilizing psychosocial counselor, staff and physician to assist with identification of specific Stressors or current issues interfering with healing process. Setting desired goal for each stressor or current issue identified.;Long Term Goal: Stressors or current issues are controlled or eliminated.;Short Term goal: Identification and review with participant of any Quality of Life or Depression concerns found by scoring the questionnaire.;Long Term goal: The participant improves quality of Life and PHQ9 Scores as seen by post scores and/or verbalization of changes       Quality of Life Scores:  Quality of Life - 02/27/19 1115      Quality of Life   Select  Quality of Life      Quality of Life Scores   Health/Function Pre  27.83 %    Socioeconomic Pre  28.07 %    Psych/Spiritual Pre  28.29 %    Family Pre  30 %    GLOBAL Pre  28.29 %      Scores of 19 and below usually indicate a poorer quality of life in these areas.  A difference of  2-3 points is a clinically meaningful difference.  A difference of 2-3 points in the total score of the Quality of Life Index has been associated with significant improvement in overall quality of life, self-image, physical symptoms, and general  health in studies assessing change in quality of life.  PHQ-9: Recent Review Flowsheet Data    Depression screen [facility] 2/9 02/27/2019  Decreased Interest 0   Down, Depressed, Hopeless 0   PHQ - 2 Score 0   Altered sleeping 1   Tired, decreased energy 1   Change in appetite 0   Feeling bad or failure about yourself  0   Trouble concentrating 0   Moving slowly or fidgety/restless 0   Suicidal thoughts 0   PHQ-9 Score 2   Difficult doing work/chores Not difficult at all     Interpretation of Total Score  Total Score Depression Severity:  1-4 = Minimal depression, 5-9 = Mild depression, 10-14 = Moderate depression, 15-19 = Moderately severe depression, 20-27 = Severe depression   Psychosocial Evaluation and Intervention:   Psychosocial Re-Evaluation: Psychosocial Re-Evaluation    [place] Name 03/13/19 0745 04/03/19 0735 04/29/19 0100         Psychosocial Re-Evaluation   Current issues with  Current Sleep Concerns  Current Sleep Concerns  Current Sleep Concerns     Comments  Breylan is doing well overall.  He is fatigued in afternoon after working 12 hour shifts.  He usually will sleep in 4 hour bouts and then wake and go back to sleep.  He used to work third shift and it still sticks with him.  Overall, he is doing well.  Bowen is doing well overall. He has no major stressors.  He is still waking up in the early hours and tosses and turns for a bit.  He will go out to work in garden and then nap in afternoon to get enough sleep.  He is still having a hard time adjusting to his 12 hour days again at work.  Gildo is trying to go to bed early since he is know on day shift. He has been on nights for 22 years. He gets tired in the afternoon and takes a nap. Informed patient that it will take a little time to get back into a regular sleep cycle after being on night shift for so long. Patient verbalizes understanding.     Expected Outcomes  Short: Continue to try to sleep better and get his 6-7 hours.   Long: Continue to make time for self and exercise.  Short: Continue to try to sleep better and get his 6-7 hours.  Long: Continue to make time for self and exercise.  Short: continue to go to bed early. Long: get on a regular sleep cycle.     Interventions  Encouraged to attend Cardiac Rehabilitation for the exercise  Encouraged to attend Cardiac Rehabilitation for the exercise  Encouraged to attend Cardiac Rehabilitation for the exercise     Continue Psychosocial Services   Follow up required by staff  Follow up required by staff  Follow up required by staff        Psychosocial Discharge (Final Psychosocial Re-Evaluation): Psychosocial Re-Evaluation - 04/29/19 0812      Psychosocial Re-Evaluation   Current issues with  Current Sleep Concerns    Comments  Janson is trying to go to bed early since he is know on day shift. He has been on nights for 22 years. He gets tired in the afternoon and takes a nap. Informed patient that it will take a little time to get back into a regular sleep cycle after being on night shift for so long. Patient verbalizes understanding.    Expected Outcomes  Short: continue to go to bed early. Long: get on a regular sleep cycle.    Interventions  Encouraged to attend Cardiac Rehabilitation for the exercise  Continue Psychosocial Services   Follow up required by staff       Vocational Rehabilitation: Provide vocational rehab assistance to qualifying candidates.   Vocational Rehab Evaluation & Intervention: Vocational Rehab - 02/26/19 1039      Initial Vocational Rehab Evaluation & Intervention   Assessment shows need for Vocational Rehabilitation  No       Education: Education Goals: Education classes will be provided on a variety of topics geared toward better understanding of heart health and risk factor modification. Participant will state understanding/return demonstration of topics presented as noted by education test scores.  Learning  Barriers/Preferences: Learning Barriers/Preferences - 02/26/19 1039      Learning Barriers/Preferences   Learning Barriers  None    Learning Preferences  None       Education Topics:  AED/CPR: - Group verbal and written instruction with the use of models to demonstrate the basic use of the AED with the basic ABC's of resuscitation.   General Nutrition Guidelines/Fats and Fiber: -Group instruction provided by verbal, written material, models and posters to present the general guidelines for heart healthy nutrition. Gives an explanation and review of dietary fats and fiber.   Cardiac Rehab from 05/08/2019 in [facility] Cardiac and Pulmonary Rehab  Date  05/08/19  Educator  [facility]  Instruction Review Code  1- Verbalizes Understanding      Controlling Sodium/Reading Food Labels: -Group verbal and written material supporting the discussion of sodium use in heart healthy nutrition. Review and explanation with models, verbal and written materials for utilization of the food label.   Exercise Physiology & General Exercise Guidelines: - Group verbal and written instruction with models to review the exercise physiology of the cardiovascular system and associated critical values. Provides general exercise guidelines with specific guidelines to those with heart or lung disease.    Aerobic Exercise & Resistance Training: - Gives group verbal and written instruction on the various components of exercise. Focuses on aerobic and resistive training programs and the benefits of this training and how to safely progress through these programs..   Cardiac Rehab from 05/08/2019 in [facility] Cardiac and Pulmonary Rehab  Date  04/24/19  Educator  [facility]  Instruction Review Code  1- Verbalizes Understanding      Flexibility, Balance, Mind/Body Relaxation: Provides group verbal/written instruction on the benefits of flexibility and balance training, including mind/body exercise modes such as yoga, pilates and tai chi.   Demonstration and skill practice provided.   Cardiac Rehab from 05/08/2019 in [facility] Cardiac and Pulmonary Rehab  Date  05/08/19  Educator  as  Instruction Review Code  1- Verbalizes Understanding      Stress and Anxiety: - Provides group verbal and written instruction about the health risks of elevated stress and causes of high stress.  Discuss the correlation between heart/lung disease and anxiety and treatment options. Review healthy ways to manage with stress and anxiety.   Depression: - Provides group verbal and written instruction on the correlation between heart/lung disease and depressed mood, treatment options, and the stigmas associated with seeking treatment.   Anatomy & Physiology of the Heart: - Group verbal and written instruction and models provide basic cardiac anatomy and physiology, with the coronary electrical and arterial systems. Review of Valvular disease and Heart Failure   Cardiac Procedures: - Group verbal and written instruction to review commonly prescribed medications for heart disease. Reviews the medication, class of the drug, and side effects. Includes the steps to properly store meds and maintain the prescription  regimen. (beta blockers and nitrates)   Cardiac Rehab from 05/08/2019 in [facility] Cardiac and Pulmonary Rehab  Date  04/24/19  Educator  [facility]  Instruction Review Code  1- Verbalizes Understanding      Cardiac Medications I: - Group verbal and written instruction to review commonly prescribed medications for heart disease. Reviews the medication, class of the drug, and side effects. Includes the steps to properly store meds and maintain the prescription regimen.   Cardiac Medications II: -Group verbal and written instruction to review commonly prescribed medications for heart disease. Reviews the medication, class of the drug, and side effects. (all other drug classes)   Cardiac Rehab from 04/10/2019 in [facility] Cardiac and Pulmonary Rehab  Date   04/10/19  Educator  [facility]  Instruction Review Code  1- Verbalizes Understanding       Go Sex-Intimacy & Heart Disease, Get SMART - Goal Setting: - Group verbal and written instruction through game format to discuss heart disease and the return to sexual intimacy. Provides group verbal and written material to discuss and apply goal setting through the application of the S.M.A.R.T. Method.   Cardiac Rehab from 05/08/2019 in [facility] Cardiac and Pulmonary Rehab  Date  04/24/19  Educator  [facility]  Instruction Review Code  1- Verbalizes Understanding      Other Matters of the Heart: - Provides group verbal, written materials and models to describe Stable Angina and Peripheral Artery. Includes description of the disease process and treatment options available to the cardiac patient.   Exercise & Equipment Safety: - Individual verbal instruction and demonstration of equipment use and safety with use of the equipment.   Cardiac Rehab from 02/27/2019 in [facility] Cardiac and Pulmonary Rehab  Date  02/27/19  Educator  [facility]  Instruction Review Code  1- Verbalizes Understanding      Infection Prevention: - Provides verbal and written material to individual with discussion of infection control including proper hand washing and proper equipment cleaning during exercise session.   Cardiac Rehab from 02/27/2019 in [facility] Cardiac and Pulmonary Rehab  Date  02/27/19  Educator  [facility]  Instruction Review Code  1- Verbalizes Understanding      Falls Prevention: - Provides verbal and written material to individual with discussion of falls prevention and safety.   Cardiac Rehab from 02/27/2019 in [facility] Cardiac and Pulmonary Rehab  Date  02/27/19  Educator  [facility]  Instruction Review Code  1- Verbalizes Understanding      Diabetes: - Individual verbal and written instruction to review signs/symptoms of diabetes, desired ranges of glucose level fasting, after meals and with exercise. Acknowledge that pre and post exercise  glucose checks will be done for 3 sessions at entry of program.   Know Your Numbers and Risk Factors: -Group verbal and written instruction about important numbers in your health.  Discussion of what are risk factors and how they play a role in the disease process.  Review of Cholesterol, Blood Pressure, Diabetes, and BMI and the role they play in your overall health.   Cardiac Rehab from 04/10/2019 in [facility] Cardiac and Pulmonary Rehab  Date  04/10/19  Educator  [facility]  Instruction Review Code  1- Verbalizes Understanding      Sleep Hygiene: -Provides group verbal and written instruction about how sleep can affect your health.  Define sleep hygiene, discuss sleep cycles and impact of sleep habits. Review good sleep hygiene tips.    Other: -Provides group and verbal instruction on various topics (see comments)  Knowledge Questionnaire Score:   Core Components/Risk Factors/Patient Goals at Admission: Personal Goals and Risk Factors at Admission - 02/27/19 1112      Core Components/Risk Factors/Patient Goals on Admission    Weight Management  Yes;Weight Loss    Intervention  Weight Management: Develop a combined nutrition and exercise program designed to reach desired caloric intake, while maintaining appropriate intake of nutrient and fiber, sodium and fats, and appropriate energy expenditure required for the weight goal.;Weight Management: Provide education and appropriate resources to help participant work on and attain dietary goals.    Admit Weight  202 lb 1.6 oz (91.7 kg)    Goal Weight: Short Term  195 lb (88.5 kg)    Goal Weight: Long Term  190 lb (86.2 kg)    Expected Outcomes  Short Term: Continue to assess and modify interventions until short term weight is achieved;Long Term: Adherence to nutrition and physical activity/exercise program aimed toward attainment of established weight goal;Weight Loss: Understanding of general recommendations for a balanced deficit meal plan, which  promotes 1-2 lb weight loss per week and includes a negative energy balance of [phone] kcal/d;Understanding recommendations for meals to include 15-35% energy as protein, 25-35% energy from fat, 35-60% energy from carbohydrates, less than '200mg'$  of dietary cholesterol, 20-35 gm of total fiber daily;Understanding of distribution of calorie intake throughout the day with the consumption of 4-5 meals/snacks    Heart Failure  Yes    Intervention  Provide a combined exercise and nutrition program that is supplemented with education, support and counseling about heart failure. Directed toward relieving symptoms such as shortness of breath, decreased exercise tolerance, and extremity edema.    Expected Outcomes  Improve functional capacity of life;Short term: Attendance in program 2-3 days a week with increased exercise capacity. Reported lower sodium intake. Reported increased fruit and vegetable intake. Reports medication compliance.;Short term: Daily weights obtained and reported for increase. Utilizing diuretic protocols set by physician.;Long term: Adoption of self-care skills and reduction of barriers for early signs and symptoms recognition and intervention leading to self-care maintenance.    Lipids  Yes    Intervention  Provide education and support for participant on nutrition & aerobic/resistive exercise along with prescribed medications to achieve LDL '70mg'$ , HDL >'40mg'$ .    Expected Outcomes  Short Term: Participant states understanding of desired cholesterol values and is compliant with medications prescribed. Participant is following exercise prescription and nutrition guidelines.;Long Term: Cholesterol controlled with medications as prescribed, with individualized exercise RX and with personalized nutrition plan. Value goals: LDL < '70mg'$ , HDL > 40 mg.       Core Components/Risk Factors/Patient Goals Review:  Goals and Risk Factor Review    Row Name 03/13/19 0748 04/03/19 0744 04/29/19 0815          Core Components/Risk Factors/Patient Goals Review   Personal Goals Review  Weight Management/Obesity;Hypertension;Heart Failure;Lipids  Weight Management/Obesity;Hypertension;Heart Failure;Lipids  Weight Management/Obesity;Improve shortness of breath with ADL's     Review  Mihailo is doing well with his weight. He checks it in class as he doesn't check it at home as he doesn't have a scale.  He will let us know about the scale.  Reviewed heart failure action plan with him today.  He is watching his sodium levels.  His pressures have been good in class as he does not have a cuff.  He will look into it.  We can into foundation for providing these to him.  He is doing well with his medicaitons.  Elta [place]  is staying steady with his weight around 202 lbs.  He still has not gotten a scale or cuff so he only checks them here.  His pressures have been good in class.  He has not had any heart failure symptoms.  We will look into foundation for help with scale and cuff to heart failure management.  Jameon wants to lose some more weight and be around 190 pounds. He has been eating healthy but eating to much. Informed him to drink some water before a meal and wait after heats his plate to let his food settle. He is going to try to lose 5 pounds in the next two weeks.     Expected Outcomes  Short: Look into getting scale and cuff for home use.  Long: Continue to monitor risk factors.  Short: talk to foundation.  Long: Continue to montior heart failure.  Short: lose 5 pounds in two weeks. Long: maintain weight loss independently.        Core Components/Risk Factors/Patient Goals at Discharge (Final Review):  Goals and Risk Factor Review - 04/29/19 0815      Core Components/Risk Factors/Patient Goals Review   Personal Goals Review  Weight Management/Obesity;Improve shortness of breath with ADL's    Review  Jamarr wants to lose some more weight and be around 190 pounds. He has been eating healthy but eating to much. Informed  him to drink some water before a meal and wait after heats his plate to let his food settle. He is going to try to lose 5 pounds in the next two weeks.    Expected Outcomes  Short: lose 5 pounds in two weeks. Long: maintain weight loss independently.       ITP Comments: ITP Comments    Row Name 02/26/19 1043 02/27/19 1052 03/26/19 1311 04/23/19 0634 05/21/19 1012   ITP Comments  Virtual initial orientation completed. Diagnosis can be found in CE 8/26. EP/RD orientation scheduled for 9/10 at 9:30  Completed 6MWT, gym orientation, and RD evaluation. Initial ITP created and sent for review to Dr. Emily Filbert, Medical Director.  30 day review completed. ITP sent to Dr. Emily Filbert, Medical Director of Cardiac and Pulmonary Rehab. Continue with ITP unless changes are made by physician.  Department closed starting 10/2 until further notice by infection prevention and Health at Work teams for COVID-19.  30 day review completed. Continue with ITP sent to Dr. Emily Filbert, Medical Director of Cardiac and Pulmonary Rehab for review , changes as needed and signature.  30 day review competed . ITP sent to Dr Emily Filbert for review, changes as needed and ITP approval signature.      Comments:

## 2019-05-22 ENCOUNTER — Other Ambulatory Visit: Payer: Self-pay

## 2019-05-22 ENCOUNTER — Encounter: Payer: PRIVATE HEALTH INSURANCE | Admitting: *Deleted

## 2019-05-22 DIAGNOSIS — Z952 Presence of prosthetic heart valve: Secondary | ICD-10-CM | POA: Diagnosis not present

## 2019-05-22 NOTE — Progress Notes (Signed)
Daily Session Note  Patient Details  Name: Brandon Kennedy MRN: [id number] Date of Birth: 1960-10-26 Referring Provider:     Cardiac Rehab from 02/27/2019 in [facility] Cardiac and Pulmonary Rehab  Referring Provider  Marisa Severin MD      Encounter Date: 05/22/2019  Check In: Session Check In - 05/22/19 0819      Check-In   Supervising physician immediately available to respond to emergencies  See telemetry face sheet for immediately available ER MD    Location  [facility]-Cardiac & Pulmonary Rehab    Staff Present  Heath Lark, RN, BSN, CCRP;Jeanna Durrell BS, Exercise Physiologist;Jessica [place], MA, RCEP, CCRP, CCET    Virtual Visit  No    Medication changes reported      No    Fall or balance concerns reported     No    Warm-up and Cool-down  Performed on first and last piece of equipment    Resistance Training Performed  Yes    VAD Patient?  No    PAD/SET Patient?  No      VAD patient   Has back up controller?  No      Pain Assessment   Currently in Pain?  No/denies          Social History   Tobacco Use  Smoking Status Never Smoker  Smokeless Tobacco Never Used    Goals Met:  Independence with exercise equipment Exercise tolerated well No report of cardiac concerns or symptoms  Goals Unmet:  Not Applicable  Comments: Pt able to follow exercise prescription today without complaint.  Will continue to monitor for progression.    Dr. Emily Filbert is Medical Director for [place] and [facility].

## 2019-05-27 ENCOUNTER — Encounter: Payer: PRIVATE HEALTH INSURANCE | Admitting: *Deleted

## 2019-05-27 ENCOUNTER — Other Ambulatory Visit: Payer: Self-pay

## 2019-05-27 DIAGNOSIS — Z952 Presence of prosthetic heart valve: Secondary | ICD-10-CM

## 2019-05-27 NOTE — Progress Notes (Signed)
Daily Session Note  Patient Details  Name: Brandon Kennedy MRN: [id number] Date of Birth: [birth year] Referring Provider:     Cardiac Rehab from 02/27/2019 in [facility] Cardiac and Pulmonary Rehab  Referring Provider  Marisa Severin MD      Encounter Date: 05/27/2019  Check In: Session Check In - 05/27/19 0737      Check-In   Supervising physician immediately available to respond to emergencies  See telemetry face sheet for immediately available ER MD    Location  [facility]-Cardiac & Pulmonary Rehab    Staff Present  Heath Lark, RN, BSN, CCRP;Joseph Hood RCP,RRT,BSRT    Virtual Visit  No    Medication changes reported      No    Fall or balance concerns reported     No    Warm-up and Cool-down  Performed on first and last piece of equipment    Resistance Training Performed  Yes    VAD Patient?  No    PAD/SET Patient?  No      VAD patient   Has back up controller?  No      Pain Assessment   Currently in Pain?  No/denies          Social History   Tobacco Use  Smoking Status Never Smoker  Smokeless Tobacco Never Used    Goals Met:  Independence with exercise equipment Exercise tolerated well Personal goals reviewed No report of cardiac concerns or symptoms  Goals Unmet:  Not Applicable  Comments: Pt able to follow exercise prescription today without complaint.  Will continue to monitor for progression.    Dr. Emily Filbert is Medical Director for [place] and [facility].

## 2019-05-29 ENCOUNTER — Encounter: Payer: PRIVATE HEALTH INSURANCE | Admitting: *Deleted

## 2019-05-29 ENCOUNTER — Other Ambulatory Visit: Payer: Self-pay

## 2019-05-29 DIAGNOSIS — Z952 Presence of prosthetic heart valve: Secondary | ICD-10-CM | POA: Diagnosis not present

## 2019-05-29 NOTE — Progress Notes (Signed)
Daily Session Note  Patient Details  Name: Brandon Kennedy MRN: [id number] Date of Birth: 06/07/1961 Referring Provider:     Cardiac Rehab from 02/27/2019 in [facility] Cardiac and Pulmonary Rehab  Referring Provider  Marisa Severin MD      Encounter Date: 05/29/2019  Check In: Session Check In - 05/29/19 0744      Check-In   Supervising physician immediately available to respond to emergencies  See telemetry face sheet for immediately available ER MD    Location  [facility]-Cardiac & Pulmonary Rehab    Staff Present  Heath Lark, RN, BSN, CCRP;Amanda Sommer, BA, ACSM CEP, Exercise Physiologist    Virtual Visit  No    Medication changes reported      No    Fall or balance concerns reported     No    Warm-up and Cool-down  Performed on first and last piece of equipment    Resistance Training Performed  Yes    VAD Patient?  No    PAD/SET Patient?  No      VAD patient   Has back up controller?  No      Pain Assessment   Currently in Pain?  No/denies          Social History   Tobacco Use  Smoking Status Never Smoker  Smokeless Tobacco Never Used    Goals Met:  Independence with exercise equipment Exercise tolerated well No report of cardiac concerns or symptoms  Goals Unmet:  Not Applicable  Comments: Pt able to follow exercise prescription today without complaint.  Will continue to monitor for progression.    Dr. Emily Filbert is Medical Director for [place] and [facility].

## 2019-06-03 ENCOUNTER — Other Ambulatory Visit: Payer: Self-pay

## 2019-06-03 ENCOUNTER — Encounter: Payer: PRIVATE HEALTH INSURANCE | Admitting: *Deleted

## 2019-06-03 DIAGNOSIS — Z952 Presence of prosthetic heart valve: Secondary | ICD-10-CM | POA: Diagnosis not present

## 2019-06-03 NOTE — Progress Notes (Signed)
Daily Session Note  Patient Details  Name: Brandon Kennedy MRN: [id number] Date of Birth: [birth year] Referring Provider:     Cardiac Rehab from 02/27/2019 in [facility] Cardiac and Pulmonary Rehab  Referring Provider  Brandon Severin MD      Encounter Date: 06/03/2019  Check In: Session Check In - 06/03/19 0818      Check-In   Supervising physician immediately available to respond to emergencies  See telemetry face sheet for immediately available ER MD    Location  [facility]-Cardiac & Pulmonary Rehab    Staff Present  Heath Lark, RN, BSN, CCRP;Joseph Hood RCP,RRT,BSRT    Virtual Visit  No    Medication changes reported      No    Fall or balance concerns reported     No    Warm-up and Cool-down  Performed on first and last piece of equipment    Resistance Training Performed  Yes    VAD Patient?  No    PAD/SET Patient?  No      Pain Assessment   Currently in Pain?  No/denies          Social History   Tobacco Use  Smoking Status Never Smoker  Smokeless Tobacco Never Used    Goals Met:  Independence with exercise equipment Exercise tolerated well No report of cardiac concerns or symptoms  Goals Unmet:  Not Applicable  Comments: Pt able to follow exercise prescription today without complaint.  Will continue to monitor for progression.    Dr. Emily Kennedy is Medical Director for [name] and [facility].

## 2019-06-05 ENCOUNTER — Encounter: Payer: PRIVATE HEALTH INSURANCE | Admitting: *Deleted

## 2019-06-05 ENCOUNTER — Other Ambulatory Visit: Payer: Self-pay

## 2019-06-05 DIAGNOSIS — Z952 Presence of prosthetic heart valve: Secondary | ICD-10-CM | POA: Diagnosis not present

## 2019-06-05 NOTE — Progress Notes (Signed)
Daily Session Note  Patient Details  Name: Brandon Kennedy MRN: [id number] Date of Birth: March 23, 1961 Referring Provider:     Cardiac Rehab from 02/27/2019 in [facility] Cardiac and Pulmonary Rehab  Referring Provider  Marisa Severin MD      Encounter Date: 06/05/2019  Check In: Session Check In - 06/05/19 0752      Check-In   Supervising physician immediately available to respond to emergencies  See telemetry face sheet for immediately available ER MD    Location  [facility]-Cardiac & Pulmonary Rehab    Staff Present  Heath Lark, RN, BSN, CCRP;Jessica [place], MA, RCEP, CCRP, [place], [place], ACSM CEP, Exercise Physiologist    Virtual Visit  No    Medication changes reported      No    Fall or balance concerns reported     No    Warm-up and Cool-down  Performed on first and last piece of equipment    Resistance Training Performed  Yes    VAD Patient?  No    PAD/SET Patient?  No      VAD patient   Has back up controller?  No      Pain Assessment   Currently in Pain?  No/denies          Social History   Tobacco Use  Smoking Status Never Smoker  Smokeless Tobacco Never Used    Goals Met:  Independence with exercise equipment Exercise tolerated well No report of cardiac concerns or symptoms  Goals Unmet:  Not Applicable  Comments: Pt able to follow exercise prescription today without complaint.  Will continue to monitor for progression.    Dr. Emily Filbert is Medical Director for [place] and [facility].

## 2019-06-10 ENCOUNTER — Encounter: Payer: PRIVATE HEALTH INSURANCE | Admitting: *Deleted

## 2019-06-10 ENCOUNTER — Other Ambulatory Visit: Payer: Self-pay

## 2019-06-10 DIAGNOSIS — Z952 Presence of prosthetic heart valve: Secondary | ICD-10-CM | POA: Diagnosis not present

## 2019-06-10 NOTE — Progress Notes (Signed)
Daily Session Note  Patient Details  Name: Klint Lezcano MRN: [id number] Date of Birth: 1960/09/10 Referring Provider:     Cardiac Rehab from 02/27/2019 in [facility]  Referring Provider  Marisa Severin MD      Encounter Date: 06/10/2019  Check In: Session Check In - 06/10/19 0809      Check-In   Supervising physician immediately available to respond to emergencies  See telemetry face sheet for immediately available ER MD    Location  [facility]-Cardiac & Pulmonary Rehab    Staff Present  Heath Lark, RN, BSN, CCRP;Jessica Kennan, MA, RCEP, CCRP, CCET;Joseph [place], [place], ACSM CEP, Exercise Physiologist    Virtual Visit  No    Medication changes reported      No    Fall or balance concerns reported     No    Warm-up and Cool-down  Performed on first and last piece of equipment    Resistance Training Performed  Yes    VAD Patient?  No    PAD/SET Patient?  No      Pain Assessment   Currently in Pain?  No/denies          Social History   Tobacco Use  Smoking Status Never Smoker  Smokeless Tobacco Never Used    Goals Met:  Independence with exercise equipment Exercise tolerated well No report of cardiac concerns or symptoms  Goals Unmet:  Not Applicable  Comments: Pt able to follow exercise prescription today without complaint.  Will continue to monitor for progression.    Dr. Emily Filbert is Medical Director for [place] and [facility].

## 2019-06-12 ENCOUNTER — Other Ambulatory Visit: Payer: Self-pay

## 2019-06-12 ENCOUNTER — Encounter: Payer: PRIVATE HEALTH INSURANCE | Admitting: *Deleted

## 2019-06-12 DIAGNOSIS — Z952 Presence of prosthetic heart valve: Secondary | ICD-10-CM | POA: Diagnosis not present

## 2019-06-12 NOTE — Progress Notes (Signed)
Daily Session Note  Patient Details  Name: Brandon Kennedy MRN: [id number] Date of Birth: Mar 08, 1961 Referring Provider:     Cardiac Rehab from 02/27/2019 in [facility] Cardiac [facility] Pulmonary Rehab  Referring Provider  Marisa Severin MD      Encounter Date: 06/12/2019  Check In: Session Check In - 06/12/19 0807      Check-In   Supervising physician immediately available to respond to emergencies  See telemetry face sheet for immediately available ER MD    Location  [facility]-Cardiac & Pulmonary Rehab    Staff Present  Heath Lark, RN, BSN, CCRP;Jeanna Durrell BS, Exercise Physiologist;Jessica [place], MA, RCEP, CCRP, CCET    Virtual Visit  No    Medication changes reported      No    Fall or balance concerns reported     No    Warm-up and Cool-down  Performed on first and last piece of equipment    Resistance Training Performed  Yes    VAD Patient?  No    PAD/SET Patient?  No      VAD patient   Has back up controller?  No      Pain Assessment   Currently in Pain?  No/denies          Social History   Tobacco Use  Smoking Status Never Smoker  Smokeless Tobacco Never Used    Goals Met:  Independence with exercise equipment Exercise tolerated well No report of cardiac concerns or symptoms  Goals Unmet:  Not Applicable  Comments: Pt able to follow exercise prescription today without complaint.  Will continue to monitor for progression.    Dr. Emily Filbert is Medical Director for [place] and [facility].

## 2019-06-18 ENCOUNTER — Encounter: Payer: Self-pay | Admitting: *Deleted

## 2019-06-18 DIAGNOSIS — Z952 Presence of prosthetic heart valve: Secondary | ICD-10-CM

## 2019-06-18 NOTE — Progress Notes (Signed)
Cardiac Individual Treatment Plan  Patient Details  Name: Brandon Kennedy MRN: [id number] Date of Birth: 01-27-1961 Referring Provider:     Cardiac Rehab from 02/27/2019 in [facility] Cardiac and Pulmonary Rehab  Referring Provider  Marisa Severin MD      Initial Encounter Date:    Cardiac Rehab from 02/27/2019 in [facility] Cardiac and Pulmonary Rehab  Date  02/27/19      Visit Diagnosis: S/P TAVR (transcatheter aortic valve replacement)  Patient's Home Medications on Admission:  Current Outpatient Medications:  .  acetaminophen (TYLENOL) 500 MG tablet, Take by mouth., Disp: , Rfl:  .  aspirin EC 81 MG tablet, Take by mouth., Disp: , Rfl:  .  losartan (COZAAR) 25 MG tablet, Take by mouth., Disp: , Rfl:  .  metoprolol succinate (TOPROL-XL) 25 MG 24 hr tablet, Take by mouth., Disp: , Rfl:   Past Medical History: No past medical history on file.  Tobacco Use: Social History   Tobacco Use  Smoking Status Never Smoker  Smokeless Tobacco Never Used    Labs: Recent Review Flowsheet Data    There is no flowsheet data to display.       Exercise Target Goals: Exercise Program Goal: Individual exercise prescription set using results from initial 6 min walk test and THRR while considering  patient's activity barriers and safety.   Exercise Prescription Goal: Initial exercise prescription builds to 30-45 minutes a day of aerobic activity, 2-3 days per week.  Home exercise guidelines will be given to patient during program as part of exercise prescription that the participant will acknowledge.  Activity Barriers & Risk Stratification: Activity Barriers & Cardiac Risk Stratification - 02/27/19 1054      Activity Barriers & Cardiac Risk Stratification   Activity Barriers  Arthritis;Joint Problems;Decreased Ventricular Function;Muscular Weakness   bilateral knee pain   Cardiac Risk Stratification  Low       6 Minute Walk: 6 Minute Walk    Row Name 02/27/19 1052         6 Minute  Walk   Phase  Initial     Distance  1806 feet     Walk Time  6 minutes     # of Rest Breaks  0     MPH  3.42     METS  5.13     RPE  14     VO2 Peak  17.97     Symptoms  No     Resting HR  85 bpm     Resting BP  142/86     Resting Oxygen Saturation   98 %     Exercise Oxygen Saturation  during 6 min walk  99 %     Max Ex. HR  128 bpm     Max Ex. BP  184/74 recheck BP prior to resistance training 164/72     2 Minute Post BP  142/74        Oxygen Initial Assessment:   Oxygen Re-Evaluation:   Oxygen Discharge (Final Oxygen Re-Evaluation):   Initial Exercise Prescription: Initial Exercise Prescription - 02/27/19 1000      Date of Initial Exercise RX and Referring Provider   Date  02/27/19    Referring Provider  Marisa Severin MD      Treadmill   MPH  3.3    Grade  1    Minutes  15    METs  3.71      REL-XR   Level  3  Speed  50    Minutes  15    METs  3.5      T5 Nustep   Level  4    SPM  80    Minutes  15    METs  3.5      Prescription Details   Frequency (times per week)  2    Duration  Progress to 30 minutes of continuous aerobic without signs/symptoms of physical distress      Intensity   THRR 40-80% of Max Heartrate  115-145    Ratings of Perceived Exertion  11-13    Perceived Dyspnea  0-4      Progression   Progression  Continue to progress workloads to maintain intensity without signs/symptoms of physical distress.      Resistance Training   Training Prescription  Yes    Weight  4 lbs    Reps  10-15       Perform Capillary Blood Glucose checks as needed.  Exercise Prescription Changes: Exercise Prescription Changes    Row Name 02/27/19 1100 03/07/19 0700 03/11/19 1400 03/19/19 1400 04/02/19 1000     Response to Exercise   Blood Pressure (Admit)  142/86  128/64  -  94/60  126/66   Blood Pressure (Exercise)  184/74 rck 164/72  146/62  -  140/78  144/64   Blood Pressure (Exit)  142/74  124/70  -  94/56  118/64   Heart Rate (Admit)   85 bpm  68 bpm  -  68 bpm  63 bpm   Heart Rate (Exercise)  128 bpm  127 bpm  -  107 bpm  102 bpm   Heart Rate (Exit)  92 bpm  -  -  71 bpm  83 bpm   Oxygen Saturation (Admit)  98 %  -  -  -  -   Oxygen Saturation (Exercise)  99 %  -  -  -  -   Rating of Perceived Exertion (Exercise)  14  13  -  13  13   Symptoms  none  none  -  none  none   Comments  walk test results  -  -  -  -   Duration  -  Continue with 30 min of aerobic exercise without signs/symptoms of physical distress.  -  Continue with 30 min of aerobic exercise without signs/symptoms of physical distress.  Continue with 30 min of aerobic exercise without signs/symptoms of physical distress.   Intensity  -  THRR unchanged  -  THRR unchanged  THRR unchanged     Progression   Progression  -  -  -  Continue to progress workloads to maintain intensity without signs/symptoms of physical distress.  Continue to progress workloads to maintain intensity without signs/symptoms of physical distress.   Average METs  -  -  -  3.69  3.33     Resistance Training   Training Prescription  -  Yes  -  Yes  Yes   Weight  -  4 lb  -  5 lbs  5 lbs   Reps  -  10-15  -  10-15  10-15     Interval Training   Interval Training  -  No  -  -  No     Treadmill   MPH  -  3  -  3.3  3.3   Grade  -  1  -  1  1   Minutes  -  15  -  15  15   METs  -  3.71  -  3.98  3.98     REL-XR   Level  -  3  -  8  8   Speed  -  50  -  -  -   Minutes  -  15  -  15  15   METs  -  2.8  -  4.8  3.1     T5 Nustep   Level  -  4  -  5  5   SPM  -  80  -  -  -   Minutes  -  15  -  15  15   METs  -  2.7  -  2.3  2.9     Home Exercise Plan   Plans to continue exercise at  -  -  Home (comment)  Home (comment)  Home (comment)   Frequency  -  -  Add 3 additional days to program exercise sessions.  Add 3 additional days to program exercise sessions.  Add 3 additional days to program exercise sessions.   Initial Home Exercises Provided  -  -  03/11/19  03/11/19  03/11/19    Row Name 04/15/19 1400 04/30/19 1200 05/13/19 0900 05/28/19 1500 06/10/19 1400     Response to Exercise   Blood Pressure (Admit)  110/60  122/54  126/58  134/70  132/74   Blood Pressure (Exercise)  142/72  132/62  150/64  142/60  144/74   Blood Pressure (Exit)  104/64  130/64  128/74  136/70  122/64   Heart Rate (Admit)  57 bpm  65 bpm  66 bpm  120 bpm  63 bpm   Heart Rate (Exercise)  123 bpm  117 bpm  123 bpm  128 bpm  130 bpm   Heart Rate (Exit)  72 bpm  77 bpm  82 bpm  93 bpm  94 bpm   Rating of Perceived Exertion (Exercise)  '13  15  15  15  15   '$ Symptoms  none  none  none  none  none   Duration  Continue with 30 min of aerobic exercise without signs/symptoms of physical distress.  Continue with 30 min of aerobic exercise without signs/symptoms of physical distress.  Continue with 30 min of aerobic exercise without signs/symptoms of physical distress.  Continue with 30 min of aerobic exercise without signs/symptoms of physical distress.  Continue with 30 min of aerobic exercise without signs/symptoms of physical distress.   Intensity  THRR unchanged  THRR unchanged  THRR unchanged  THRR unchanged  THRR unchanged     Progression   Progression  Continue to progress workloads to maintain intensity without signs/symptoms of physical distress.  Continue to progress workloads to maintain intensity without signs/symptoms of physical distress.  Continue to progress workloads to maintain intensity without signs/symptoms of physical distress.  Continue to progress workloads to maintain intensity without signs/symptoms of physical distress.  Continue to progress workloads to maintain intensity without signs/symptoms of physical distress.   Average METs  3.6  5.87  4.7  6.9  6     Resistance Training   Training Prescription  Yes  Yes  Yes  Yes  Yes   Weight  5 lb  7 lb  7 lb  7 lb  7 lb   Reps  10-15  10-15  10-15  10-15  10-15     Interval Training  Interval Training  No  Yes  Yes  Yes  -    Equipment  -  Treadmill;REL-XR;T5 Nustep  Treadmill;Recumbant Elliptical;T5 Nustep  Treadmill;Recumbant Elliptical;T5 Nustep  -   Comments  -  2 min off 30 sec on (1:1 on TM)  2 mon off 30 sec on  2 min off 30 sec on  -     Treadmill   MPH  3.3  3.3  -  3.3  3.3   Grade  1  7  -  7.5  9   Minutes  15  15  -  15  15   METs  3.98  6.4  -  6.9  6.9     Elliptical   Level  -  -  -  2  3   Speed  -  -  -  4  4   Minutes  -  -  -  15  15     REL-XR   Level  '8  10  10  '$ -  -   Minutes  '15  15  15  '$ -  -   METs  4.8  7.4  6.7  -  -     T5 Nustep   Level  '6  6  7  '$ -  -   Minutes  '15  15  15  '$ -  -   METs  2.4  3.5  2.7  -  -     Home Exercise Plan   Plans to continue exercise at  Home (comment)  Home (comment)  Home (comment)  Home (comment)  Home (comment)   Frequency  Add 3 additional days to program exercise sessions.  Add 3 additional days to program exercise sessions.  Add 3 additional days to program exercise sessions.  Add 3 additional days to program exercise sessions.  Add 3 additional days to program exercise sessions.   Initial Home Exercises Provided  -  03/11/19  03/11/19  03/11/19  03/11/19      Exercise Comments: Exercise Comments    Row Name 03/04/19 0800           Exercise Comments  First full day of exercise!  Patient was oriented to gym and equipment including functions, settings, policies, and procedures.  Patient's individual exercise prescription and treatment plan were reviewed.  All starting workloads were established based on the results of the 6 minute walk test done at initial orientation visit.  The plan for exercise progression was also introduced and progression will be customized based on patient's performance and goals.          Exercise Goals and Review: Exercise Goals    Row Name 02/27/19 1109             Exercise Goals   Increase Physical Activity  Yes       Intervention  Provide advice, education, support and counseling about physical  activity/exercise needs.;Develop an individualized exercise prescription for aerobic and resistive training based on initial evaluation findings, risk stratification, comorbidities and participant's personal goals.       Expected Outcomes  Short Term: Attend rehab on a regular basis to increase amount of physical activity.;Long Term: Add in home exercise to make exercise part of routine and to increase amount of physical activity.;Long Term: Exercising regularly at least 3-5 days a week.       Increase Strength and Stamina  Yes       Intervention  Provide advice, education,  support and counseling about physical activity/exercise needs.;Develop an individualized exercise prescription for aerobic and resistive training based on initial evaluation findings, risk stratification, comorbidities and participant's personal goals.       Expected Outcomes  Short Term: Increase workloads from initial exercise prescription for resistance, speed, and METs.;Short Term: Perform resistance training exercises routinely during rehab and add in resistance training at home;Long Term: Improve cardiorespiratory fitness, muscular endurance and strength as measured by increased METs and functional capacity (6MWT)       Able to understand and use rate of perceived exertion (RPE) scale  Yes       Intervention  Provide education and explanation on how to use RPE scale       Expected Outcomes  Short Term: Able to use RPE daily in rehab to express subjective intensity level;Long Term:  Able to use RPE to guide intensity level when exercising independently       Knowledge and understanding of Target Heart Rate Range (THRR)  Yes       Intervention  Provide education and explanation of THRR including how the numbers were predicted and where they are located for reference       Expected Outcomes  Short Term: Able to state/look up THRR;Short Term: Able to use daily as guideline for intensity in rehab;Long Term: Able to use THRR to govern  intensity when exercising independently       Able to check pulse independently  Yes       Intervention  Provide education and demonstration on how to check pulse in carotid and radial arteries.;Review the importance of being able to check your own pulse for safety during independent exercise       Expected Outcomes  Short Term: Able to explain why pulse checking is important during independent exercise;Long Term: Able to check pulse independently and accurately       Understanding of Exercise Prescription  Yes       Intervention  Provide education, explanation, and written materials on patient's individual exercise prescription       Expected Outcomes  Long Term: Able to explain home exercise prescription to exercise independently;Short Term: Able to explain program exercise prescription          Exercise Goals Re-Evaluation : Exercise Goals Re-Evaluation    [place] Name 03/04/19 0800 03/07/19 0745 03/13/19 0740 03/18/19 1520 04/01/19 1545     Exercise Goal Re-Evaluation   Exercise Goals Review  Increase Physical Activity;Increase Strength and Stamina;Able to understand and use rate of perceived exertion (RPE) scale;Able to understand and use Dyspnea scale;Knowledge and understanding of Target Heart Rate Range (THRR);Able to check pulse independently;Understanding of Exercise Prescription  Increase Physical Activity;Increase Strength and Stamina;Able to understand and use rate of perceived exertion (RPE) scale;Able to understand and use Dyspnea scale;Knowledge and understanding of Target Heart Rate Range (THRR);Able to check pulse independently;Understanding of Exercise Prescription  Increase Physical Activity;Increase Strength and Stamina;Understanding of Exercise Prescription  Increase Physical Activity;Increase Strength and Stamina;Understanding of Exercise Prescription  Increase Physical Activity;Increase Strength and Stamina;Understanding of Exercise Prescription   Comments  Reviewed RPE scale, THR  and program prescription with pt today.  Pt voiced understanding and was given a copy of goals to take home.  Merle is tolerating exercise well.  Staff will continue to monitor progress.  Abdurrahman is doing well in rehab. He is off to a good start and already walking at home.  He is starting to recover his strength and stamina.  He is not  doing anything too strenuous yet.  He is fatigued after work for standing and walking for 12 hours.  He naps in the afternoons.  Sukhdeep continues to do well in rehab.  He is doing well with his workloads and should be ready to start to move them up!!  We will continue to monitor his progress.  Kalif has been doing well in rehab.  He has increased almost all of his workloads!  We will continue to monitor his progression.   Expected Outcomes  Short: Use RPE daily to regulate intensity. Long: Follow program prescription in THR.  Short - attend consistently Long - increase overall MET level  Short: Continue to walk on off days.  Long: Continue to increase stamina.  Short: Begin to increase workloads.  Long: Continue to improve stamina.  Short: Review home exercise guidelines.  Long: Continue to improve stamina.   [place] Name 04/03/19 3710 04/15/19 1406 04/29/19 0807 05/13/19 0930 05/27/19 0743     Exercise Goal Re-Evaluation   Exercise Goals Review  Increase Physical Activity;Increase Strength and Stamina;Understanding of Exercise Prescription  Increase Physical Activity;Increase Strength and Stamina;Able to understand and use rate of perceived exertion (RPE) scale;Knowledge and understanding of Target Heart Rate Range (THRR);Able to check pulse independently;Understanding of Exercise Prescription  Increase Physical Activity;Increase Strength and Stamina;Knowledge and understanding of Target Heart Rate Range (THRR)  Increase Physical Activity;Increase Strength and Stamina;Able to understand and use rate of perceived exertion (RPE) scale;Able to understand and use Dyspnea scale;Knowledge and  understanding of Target Heart Rate Range (THRR);Able to check pulse independently;Understanding of Exercise Prescription  Increase Physical Activity;Increase Strength and Stamina;Understanding of Exercise Prescription   Comments  Reynald continues to do well in rehab. He is working hard in class and exercising at home. He also stays active by gardening.  He has noticed that he is now able to do more in his garden without getting as short of breath as during the summer.  Westley is continuing to attend consistently and work in correct THR and RPE range.  He also exercsises at home.  Markavious works 3 days a week for 12 hours shifts. He walks at home and does weights twice a week. He does not exercise on the weekend but he is walking all day long. Sometimes when he is out gardening he over does it but feels like he can do alot more than he used to. He knows his target heart rate and knows how to check it manually.  Shamel does intervals during cardio sessions and has increased overall MET level.  Staff will monitor progress.  Dia is doing well in rehab.  He is enjoying intervals.  He is walking at home and using weights.  He has noticed that Mondays are tough after the weekend, but gets back into the flow by Tuesday and Wednesday.  He feels that he has his strength back.   Expected Outcomes  Short: Continue to exericse at home on off day.  Long: Continue to work on stamina in garden.  Short - continue to be consistent and increase workloads Long - increase overall MET level  Short: continue to exercise. Long: graduate [employer].  Short - continue interval training Long : complete HT  Short: Continut to add in more exercise at home.  Long: Continue to improve stamina.   [place] Name 06/10/19 1432             Exercise Goal Re-Evaluation   Exercise Goals Review  Increase Physical Activity;Increase Strength and Stamina;Able  to understand and use rate of perceived exertion (RPE) scale;Knowledge and understanding of Target Heart  Rate Range (THRR);Able to check pulse independently;Understanding of Exercise Prescription       Comments  Cameron has increased intervals to 9% on TM.  He continues to be consistent in attendance and working on intervals       Expected Outcomes  Short :  continue intervals Long - maintain fitness gains on his own          Discharge Exercise Prescription (Final Exercise Prescription Changes): Exercise Prescription Changes - 06/10/19 1400      Response to Exercise   Blood Pressure (Admit)  132/74    Blood Pressure (Exercise)  144/74    Blood Pressure (Exit)  122/64    Heart Rate (Admit)  63 bpm    Heart Rate (Exercise)  130 bpm    Heart Rate (Exit)  94 bpm    Rating of Perceived Exertion (Exercise)  15    Symptoms  none    Duration  Continue with 30 min of aerobic exercise without signs/symptoms of physical distress.    Intensity  THRR unchanged      Progression   Progression  Continue to progress workloads to maintain intensity without signs/symptoms of physical distress.    Average METs  6      Resistance Training   Training Prescription  Yes    Weight  7 lb    Reps  10-15      Treadmill   MPH  3.3    Grade  9    Minutes  15    METs  6.9      Elliptical   Level  3    Speed  4    Minutes  15      Home Exercise Plan   Plans to continue exercise at  Home (comment)    Frequency  Add 3 additional days to program exercise sessions.    Initial Home Exercises Provided  03/11/19       Nutrition:  Target Goals: Understanding of nutrition guidelines, daily intake of sodium '1500mg'$ , cholesterol '200mg'$ , calories 30% from fat and 7% or less from saturated fats, daily to have 5 or more servings of fruits and vegetables.  Biometrics: Pre Biometrics - 02/27/19 1112      Pre Biometrics   Height  5' 11.2" (1.808 m)    Weight  202 lb 1.6 oz (91.7 kg)    BMI (Calculated)  28.04    Single Leg Stand  30 seconds        Nutrition Therapy Plan and Nutrition Goals: Nutrition  Therapy & Goals - 02/27/19 1048      Nutrition Therapy   Diet  low Na, HH diet    Protein (specify units)  95g    Fiber  30 grams    Whole Grain Foods  3 servings    Saturated Fats  12 max. grams    Fruits and Vegetables  5 servings/day    Sodium  1.5 grams      Personal Nutrition Goals   Nutrition Goal  ST: take note of the ranges of Na you have daily  LT: have and improved EF    Comments  Pt and wife used a lot of diet culture buzz words "clean eating", "real food", "good food", "bad food", "chemicals". This RD discussed some myths and facts about food and food balance. Pt reports eating raisin bran with whole milk, either leftovers, whole grain sandwich  with [place], or microwave meal (wife reports its a healthy one from the co-op), snacks are usually dry roasted/unsalted nuts or fruit, dinner is either chicken (red meat 2x/week) or plant protein like bean salad, whole grains, and vegetables. Pt and wife report eating mostly fresh foods and food from their garden. They choose healthy fats like olive oil and will use butter (but not as much). on weekends pt will have an egg with an english muffin. Pt wife reports cooking almost all the food from scratch and using salt which she has tried to cut back. Disucssed HH eating, low sodium eating, and fiber. Pt reports he likes what he is eating.      Intervention Plan   Intervention  Prescribe, educate and counsel regarding individualized specific dietary modifications aiming towards targeted core components such as weight, hypertension, lipid management, diabetes, heart failure and other comorbidities.;Nutrition handout(s) given to patient.    Expected Outcomes  Short Term Goal: Understand basic principles of dietary content, such as calories, fat, sodium, cholesterol and nutrients.;Short Term Goal: A plan has been developed with personal nutrition goals set during dietitian appointment.;Long Term Goal: Adherence to prescribed nutrition plan.        Nutrition Assessments: Nutrition Assessments - 02/27/19 1048      MEDFICTS Scores   Pre Score  32       Nutrition Goals Re-Evaluation: Nutrition Goals Re-Evaluation    Row Name 04/22/19 1131 05/13/19 0812 05/29/19 0814         Goals   Nutrition Goal  ST: take note of the ranges of Na you have daily  LT: have and improved EF  ST: Be mindful of hunger scale LT: have improved EF  ST: Continue with current chnages LT: have improved EF     Comment  Continue with current changes  more aware of sodium, now uses lower salt in cooking. Talked about frozen meals over the weekend with wife. Thinks he eats too much on the weekend. bean and quinoa salads. using spices rather than salt. wife made broccoli salad.  Pt reports doing well with diet, really enjoys wifes broccoli salad that has a variety of fruits and vegetables and reports it is very filling. Pt reports listening to his hunger cues and monitoring his Na. Pt feels he didn't eat so HH this weekend due to neighbor prepparing homemade smoked BBQ- discussed balance and overall habits more important than individual day (pt reports that he does not have this normally).     Expected Outcome  ST: take note of the ranges of Na you have daily  LT: have and improved EF  ST: Be mindful of hunger scale LT: have improved EF  ST: Continue with current chnages LT: have improved EF        Nutrition Goals Discharge (Final Nutrition Goals Re-Evaluation): Nutrition Goals Re-Evaluation - 05/29/19 0814      Goals   Nutrition Goal  ST: Continue with current chnages LT: have improved EF    Comment  Pt reports doing well with diet, really enjoys wifes broccoli salad that has a variety of fruits and vegetables and reports it is very filling. Pt reports listening to his hunger cues and monitoring his Na. Pt feels he didn't eat so HH this weekend due to neighbor prepparing homemade smoked BBQ- discussed balance and overall habits more important than individual day  (pt reports that he does not have this normally).    Expected Outcome  ST: Continue with current chnages LT: have  improved EF       Psychosocial: Target Goals: Acknowledge presence or absence of significant depression and/or stress, maximize coping skills, provide positive support system. Participant is able to verbalize types and ability to use techniques and skills needed for reducing stress and depression.   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 He is glad to be rid of his LifeVest, but wanting to make sure his heart is okay. He and his wife are making lifestyle changes and he wants to get more education regarding his health's future.      Family Dynamics   Good Support System?  Yes      Barriers   Psychosocial barriers to participate in program  There are no identifiable barriers or psychosocial needs.;The patient should benefit from training in stress management and relaxation.      Screening Interventions   Interventions  Encouraged to exercise;To provide support and resources with identified psychosocial needs;Provide feedback about the scores to participant    Expected Outcomes  Short Term goal: Utilizing psychosocial counselor, staff and physician to assist with identification of specific Stressors or current issues interfering with healing process. Setting desired goal for each stressor or current issue identified.;Long Term Goal: Stressors or current issues are controlled or eliminated.;Short Term goal: Identification and review with participant of any Quality of Life or Depression concerns found by scoring the questionnaire.;Long Term goal: The participant improves quality of Life and PHQ9 Scores as seen by post scores and/or verbalization of changes       Quality of Life  Scores:  Quality of Life - 02/27/19 1115      Quality of Life   Select  Quality of Life      Quality of Life Scores   Health/Function Pre  27.83 %    Socioeconomic Pre  28.07 %    Psych/Spiritual Pre  28.29 %    Family Pre  30 %    GLOBAL Pre  28.29 %      Scores of 19 and below usually indicate a poorer quality of life in these areas.  A difference of  2-3 points is a clinically meaningful difference.  A difference of 2-3 points in the total score of the Quality of Life Index has been associated with significant improvement in overall quality of life, self-image, physical symptoms, and general health in studies assessing change in quality of life.  PHQ-9: Recent Review Flowsheet Data    Depression screen [facility] 2/9 02/27/2019   Decreased Interest 0   Down, Depressed, Hopeless 0   PHQ - 2 Score 0   Altered sleeping 1   Tired, decreased energy 1   Change in appetite 0   Feeling bad or failure about yourself  0   Trouble concentrating 0   Moving slowly or fidgety/restless 0   Suicidal thoughts 0   PHQ-9 Score 2   Difficult doing work/chores Not difficult at all     Interpretation of Total Score  Total Score Depression Severity:  1-4 = Minimal depression, 5-9 = Mild depression, 10-14 = Moderate depression, 15-19 = Moderately severe depression, 20-27 = Severe depression   Psychosocial Evaluation and Intervention:   Psychosocial Re-Evaluation: Psychosocial Re-Evaluation    [place] Name 03/13/19 0745 04/03/19 0735 04/29/19  2878 05/27/19 0745       Psychosocial Re-Evaluation   Current issues with  Current Sleep Concerns  Current Sleep Concerns  Current Sleep Concerns  Current Sleep Concerns    Comments  Demetrias is doing well overall.  He is fatigued in afternoon after working 12 hour shifts.  He usually will sleep in 4 hour bouts and then wake and go back to sleep.  He used to work third shift and it still sticks with him.  Overall, he is doing well.  Jamieson is doing well overall. He has  no major stressors.  He is still waking up in the early hours and tosses and turns for a bit.  He will go out to work in garden and then nap in afternoon to get enough sleep.  He is still having a hard time adjusting to his 12 hour days again at work.  Chidiebere is trying to go to bed early since he is know on day shift. He has been on nights for 22 years. He gets tired in the afternoon and takes a nap. Informed patient that it will take a little time to get back into a regular sleep cycle after being on night shift for so long. Patient verbalizes understanding.  Nizar is doing well mentally.  He still is only getting a solid 4 hours and then wakes.  He has stopped his afternoon naps.  Mondays are his toughest due to his work schedule.  Overall his is in a good place mentally.  He is doing well at work.  He walks a lot at work and it hurts his knees and feet.  He wears three pairs of boots to rotate through.    Expected Outcomes  Short: Continue to try to sleep better and get his 6-7 hours.  Long: Continue to make time for self and exercise.  Short: Continue to try to sleep better and get his 6-7 hours.  Long: Continue to make time for self and exercise.  Short: continue to go to bed early. Long: get on a regular sleep cycle.  Short: Continue to work on sleep.  Long: Continue to sleep better.    Interventions  Encouraged to attend Cardiac Rehabilitation for the exercise  Encouraged to attend Cardiac Rehabilitation for the exercise  Encouraged to attend Cardiac Rehabilitation for the exercise  Encouraged to attend Cardiac Rehabilitation for the exercise    Continue Psychosocial Services   Follow up required by staff  Follow up required by staff  Follow up required by staff  Follow up required by staff       Psychosocial Discharge (Final Psychosocial Re-Evaluation): Psychosocial Re-Evaluation - 05/27/19 0745      Psychosocial Re-Evaluation   Current issues with  Current Sleep Concerns    Comments  Laurier is doing  well mentally.  He still is only getting a solid 4 hours and then wakes.  He has stopped his afternoon naps.  Mondays are his toughest due to his work schedule.  Overall his is in a good place mentally.  He is doing well at work.  He walks a lot at work and it hurts his knees and feet.  He wears three pairs of boots to rotate through.    Expected Outcomes  Short: Continue to work on sleep.  Long: Continue to sleep better.    Interventions  Encouraged to attend Cardiac Rehabilitation for the exercise    Continue Psychosocial Services   Follow up required by staff  Vocational Rehabilitation: Provide vocational rehab assistance to qualifying candidates.   Vocational Rehab Evaluation & Intervention: Vocational Rehab - 02/26/19 1039      Initial Vocational Rehab Evaluation & Intervention   Assessment shows need for Vocational Rehabilitation  No       Education: Education Goals: Education classes will be provided on a variety of topics geared toward better understanding of heart health and risk factor modification. Participant will state understanding/return demonstration of topics presented as noted by education test scores.  Learning Barriers/Preferences: Learning Barriers/Preferences - 02/26/19 1039      Learning Barriers/Preferences   Learning Barriers  None    Learning Preferences  None       Education Topics:  AED/CPR: - Group verbal and written instruction with the use of models to demonstrate the basic use of the AED with the basic ABC's of resuscitation.   General Nutrition Guidelines/Fats and Fiber: -Group instruction provided by verbal, written material, models and posters to present the general guidelines for heart healthy nutrition. Gives an explanation and review of dietary fats and fiber.   Cardiac Rehab from 05/22/2019 in [facility] Cardiac and Pulmonary Rehab  Date  05/08/19  Educator  [facility]  Instruction Review Code  1- Verbalizes Understanding      Controlling  Sodium/Reading Food Labels: -Group verbal and written material supporting the discussion of sodium use in heart healthy nutrition. Review and explanation with models, verbal and written materials for utilization of the food label.   Exercise Physiology & General Exercise Guidelines: - Group verbal and written instruction with models to review the exercise physiology of the cardiovascular system and associated critical values. Provides general exercise guidelines with specific guidelines to those with heart or lung disease.    Aerobic Exercise & Resistance Training: - Gives group verbal and written instruction on the various components of exercise. Focuses on aerobic and resistive training programs and the benefits of this training and how to safely progress through these programs..   Cardiac Rehab from 05/22/2019 in [facility] Cardiac and Pulmonary Rehab  Date  04/24/19  Educator  [place]  Instruction Review Code  1- Verbalizes Understanding      Flexibility, Balance, Mind/Body Relaxation: Provides group verbal/written instruction on the benefits of flexibility and balance training, including mind/body exercise modes such as yoga, pilates and tai chi.  Demonstration and skill practice provided.   Cardiac Rehab from 05/22/2019 in [facility] Cardiac and Pulmonary Rehab  Date  05/22/19  Educator  [facility]  Instruction Review Code  1- Verbalizes Understanding      Stress and Anxiety: - Provides group verbal and written instruction about the health risks of elevated stress and causes of high stress.  Discuss the correlation between heart/lung disease and anxiety and treatment options. Review healthy ways to manage with stress and anxiety.   Depression: - Provides group verbal and written instruction on the correlation between heart/lung disease and depressed mood, treatment options, and the stigmas associated with seeking treatment.   Anatomy & Physiology of the Heart: - Group verbal and written instruction and  models provide basic cardiac anatomy and physiology, with the coronary electrical and arterial systems. Review of Valvular disease and Heart Failure   Cardiac Procedures: - Group verbal and written instruction to review commonly prescribed medications for heart disease. Reviews the medication, class of the drug, and side effects. Includes the steps to properly store meds and maintain the prescription regimen. (beta blockers and nitrates)   Cardiac Rehab from 05/22/2019 in [facility] Cardiac and Pulmonary  Rehab  Date  04/24/19  Educator  [place]  Instruction Review Code  1- Verbalizes Understanding      Cardiac Medications I: - Group verbal and written instruction to review commonly prescribed medications for heart disease. Reviews the medication, class of the drug, and side effects. Includes the steps to properly store meds and maintain the prescription regimen.   Cardiac Medications II: -Group verbal and written instruction to review commonly prescribed medications for heart disease. Reviews the medication, class of the drug, and side effects. (all other drug classes)   Cardiac Rehab from 04/10/2019 in [facility] Cardiac and Pulmonary Rehab  Date  04/10/19  Educator  [facility]  Instruction Review Code  1- Verbalizes Understanding       Go Sex-Intimacy & Heart Disease, Get SMART - Goal Setting: - Group verbal and written instruction through game format to discuss heart disease and the return to sexual intimacy. Provides group verbal and written material to discuss and apply goal setting through the application of the S.M.A.R.T. Method.   Cardiac Rehab from 05/22/2019 in [facility] Cardiac and Pulmonary Rehab  Date  04/24/19  Educator  [facility]  Instruction Review Code  1- Verbalizes Understanding      Other Matters of the Heart: - Provides group verbal, written materials and models to describe Stable Angina and Peripheral Artery. Includes description of the disease process and treatment options available to the  cardiac patient.   Exercise & Equipment Safety: - Individual verbal instruction and demonstration of equipment use and safety with use of the equipment.   Cardiac Rehab from 02/27/2019 in [facility] Cardiac and Pulmonary Rehab  Date  02/27/19  Educator  [facility]  Instruction Review Code  1- Verbalizes Understanding      Infection Prevention: - Provides verbal and written material to individual with discussion of infection control including proper hand washing and proper equipment cleaning during exercise session.   Cardiac Rehab from 02/27/2019 in [facility] Cardiac and Pulmonary Rehab  Date  02/27/19  Educator  [facility]  Instruction Review Code  1- Verbalizes Understanding      Falls Prevention: - Provides verbal and written material to individual with discussion of falls prevention and safety.   Cardiac Rehab from 02/27/2019 in [facility] Cardiac and Pulmonary Rehab  Date  02/27/19  Educator  [facility]  Instruction Review Code  1- Verbalizes Understanding      Diabetes: - Individual verbal and written instruction to review signs/symptoms of diabetes, desired ranges of glucose level fasting, after meals and with exercise. Acknowledge that pre and post exercise glucose checks will be done for 3 sessions at entry of program.   Know Your Numbers and Risk Factors: -Group verbal and written instruction about important numbers in your health.  Discussion of what are risk factors and how they play a role in the disease process.  Review of Cholesterol, Blood Pressure, Diabetes, and BMI and the role they play in your overall health.   Cardiac Rehab from 04/10/2019 in [facility] Cardiac and Pulmonary Rehab  Date  04/10/19  Educator  [facility]  Instruction Review Code  1- Verbalizes Understanding      Sleep Hygiene: -Provides group verbal and written instruction about how sleep can affect your health.  Define sleep hygiene, discuss sleep cycles and impact of sleep habits. Review good sleep hygiene tips.    Other: -Provides group  and verbal instruction on various topics (see comments)   Knowledge Questionnaire Score:   Core Components/Risk Factors/Patient Goals at Admission: Personal Goals and Risk Factors  at Admission - 02/27/19 1112      Core Components/Risk Factors/Patient Goals on Admission    Weight Management  Yes;Weight Loss    Intervention  Weight Management: Develop a combined nutrition and exercise program designed to reach desired caloric intake, while maintaining appropriate intake of nutrient and fiber, sodium and fats, and appropriate energy expenditure required for the weight goal.;Weight Management: Provide education and appropriate resources to help participant work on and attain dietary goals.    Admit Weight  202 lb 1.6 oz (91.7 kg)    Goal Weight: Short Term  195 lb (88.5 kg)    Goal Weight: Long Term  190 lb (86.2 kg)    Expected Outcomes  Short Term: Continue to assess and modify interventions until short term weight is achieved;Long Term: Adherence to nutrition and physical activity/exercise program aimed toward attainment of established weight goal;Weight Loss: Understanding of general recommendations for a balanced deficit meal plan, which promotes 1-2 lb weight loss per week and includes a negative energy balance of [phone] kcal/d;Understanding recommendations for meals to include 15-35% energy as protein, 25-35% energy from fat, 35-60% energy from carbohydrates, less than '200mg'$  of dietary cholesterol, 20-35 gm of total fiber daily;Understanding of distribution of calorie intake throughout the day with the consumption of 4-5 meals/snacks    Heart Failure  Yes    Intervention  Provide a combined exercise and nutrition program that is supplemented with education, support and counseling about heart failure. Directed toward relieving symptoms such as shortness of breath, decreased exercise tolerance, and extremity edema.    Expected Outcomes  Improve functional capacity of life;Short term: Attendance  in program 2-3 days a week with increased exercise capacity. Reported lower sodium intake. Reported increased fruit and vegetable intake. Reports medication compliance.;Short term: Daily weights obtained and reported for increase. Utilizing diuretic protocols set by physician.;Long term: Adoption of self-care skills and reduction of barriers for early signs and symptoms recognition and intervention leading to self-care maintenance.    Lipids  Yes    Intervention  Provide education and support for participant on nutrition & aerobic/resistive exercise along with prescribed medications to achieve LDL '70mg'$ , HDL >'40mg'$ .    Expected Outcomes  Short Term: Participant states understanding of desired cholesterol values and is compliant with medications prescribed. Participant is following exercise prescription and nutrition guidelines.;Long Term: Cholesterol controlled with medications as prescribed, with individualized exercise RX and with personalized nutrition plan. Value goals: LDL < '70mg'$ , HDL > 40 mg.       Core Components/Risk Factors/Patient Goals Review:  Goals and Risk Factor Review    Row Name 03/13/19 0748 04/03/19 0744 04/29/19 0815 05/27/19 0746       Core Components/Risk Factors/Patient Goals Review   Personal Goals Review  Weight Management/Obesity;Hypertension;Heart Failure;Lipids  Weight Management/Obesity;Hypertension;Heart Failure;Lipids  Weight Management/Obesity;Improve shortness of breath with ADL's  Weight Management/Obesity;Improve shortness of breath with ADL's;Hypertension    Review  Bazil is doing well with his weight. He checks it in class as he doesn't check it at home as he doesn't have a scale.  He will let us know about the scale.  Reviewed heart failure action plan with him today.  He is watching his sodium levels.  His pressures have been good in class as he does not have a cuff.  He will look into it.  We can into foundation for providing these to him.  He is doing well with  his medicaitons.  Shivan is staying steady with his weight around 202 lbs.  He still has not gotten a scale or cuff so he only checks them here.  His pressures have been good in class.  He has not had any heart failure symptoms.  We will look into foundation for help with scale and cuff to heart failure management.  Giovannie wants to lose some more weight and be around 190 pounds. He has been eating healthy but eating to much. Informed him to drink some water before a meal and wait after heats his plate to let his food settle. He is going to try to lose 5 pounds in the next two weeks.  Gaston is doing well.  His weight is staying around 200-205 lbs.  He continues to eat better and drinks lots of water.  He continues to try to lose weight. His breathing is getting better and now its just the hard work that gets him.  His pressures have been good in class (he does not check at home).  He had a follow up last week and the doctor was pleased with his progress.    Expected Outcomes  Short: Look into getting scale and cuff for home use.  Long: Continue to monitor risk factors.  Short: talk to foundation.  Long: Continue to montior heart failure.  Short: lose 5 pounds in two weeks. Long: maintain weight loss independently.  Short: Continue to work on weight loss.  Long: Continue to monitor risk factors.       Core Components/Risk Factors/Patient Goals at Discharge (Final Review):  Goals and Risk Factor Review - 05/27/19 0746      Core Components/Risk Factors/Patient Goals Review   Personal Goals Review  Weight Management/Obesity;Improve shortness of breath with ADL's;Hypertension    Review  Daylin is doing well.  His weight is staying around 200-205 lbs.  He continues to eat better and drinks lots of water.  He continues to try to lose weight. His breathing is getting better and now its just the hard work that gets him.  His pressures have been good in class (he does not check at home).  He had a follow up last week and  the doctor was pleased with his progress.    Expected Outcomes  Short: Continue to work on weight loss.  Long: Continue to monitor risk factors.       ITP Comments: ITP Comments    Row Name 02/26/19 1043 02/27/19 1052 03/26/19 1311 04/23/19 0634 05/21/19 1012   ITP Comments  Virtual initial orientation completed. Diagnosis can be found in CE 8/26. EP/RD orientation scheduled for 9/10 at 9:30  Completed 6MWT, gym orientation, and RD evaluation. Initial ITP created and sent for review to Dr. Emily Filbert, Medical Director.  30 day review completed. ITP sent to Dr. Emily Filbert, Medical Director of Cardiac and Pulmonary Rehab. Continue with ITP unless changes are made by physician.  Department closed starting 10/2 until further notice by infection prevention and Health at Work teams for COVID-19.  30 day review completed. Continue with ITP sent to Dr. Emily Filbert, Medical Director of Cardiac and Pulmonary Rehab for review , changes as needed and signature.  30 day review competed . ITP sent to Dr Emily Filbert for review, changes as needed and ITP approval signature.   [place] Name 06/18/19 0850           ITP Comments  30 day review competed . ITP sent to Dr Emily Filbert for review, changes as needed and ITP approval signature  Comments:

## 2019-06-26 ENCOUNTER — Encounter: Payer: PRIVATE HEALTH INSURANCE | Attending: Internal Medicine | Admitting: *Deleted

## 2019-06-26 ENCOUNTER — Other Ambulatory Visit: Payer: Self-pay

## 2019-06-26 VITALS — Ht 71.2 in | Wt 203.9 lb

## 2019-06-26 DIAGNOSIS — Z952 Presence of prosthetic heart valve: Secondary | ICD-10-CM | POA: Diagnosis not present

## 2019-06-26 NOTE — Progress Notes (Signed)
Daily Session Note  Patient Details  Name: Brandon Kennedy MRN: [id number] Date of Birth: 12/30/1960 Referring Provider:     Cardiac Rehab from 02/27/2019 in [facility] Cardiac and Pulmonary Rehab  Referring Provider  Marisa Severin MD      Encounter Date: 06/26/2019  Check In: Session Check In - 06/26/19 0808      Check-In   Supervising physician immediately available to respond to emergencies  See telemetry face sheet for immediately available ER MD    Location  [facility]-Cardiac & Pulmonary Rehab    Staff Present  Heath Lark, RN, BSN, CCRP;Jessica [place], MA, RCEP, CCRP, CCET;Joseph Carthage RCP,RRT,BSRT    Virtual Visit  No    Medication changes reported      No    Fall or balance concerns reported     No    Warm-up and Cool-down  Performed on first and last piece of equipment    Resistance Training Performed  Yes    VAD Patient?  No    PAD/SET Patient?  No      Pain Assessment   Currently in Pain?  No/denies        6 Minute Walk    Row Name 02/27/19 1052 06/26/19 0756       6 Minute Walk   Phase  Initial  Discharge    Distance  1806 feet  1900 feet    Distance % Change  --  5.2 %    Distance Feet Change  --  94 ft    Walk Time  6 minutes  6 minutes    # of Rest Breaks  0  0    MPH  3.42  3.5    METS  5.13  4.71    RPE  14  15    VO2 Peak  17.97  16.48    Symptoms  No  No    Resting HR  85 bpm  57 bpm    Resting BP  142/86  126/72    Resting Oxygen Saturation   98 %  --    Exercise Oxygen Saturation  during 6 min walk  99 %  --    Max Ex. HR  128 bpm  115 bpm    Max Ex. BP  184/74 recheck BP prior to resistance training 164/72  138/70    2 Minute Post BP  142/74  --         Social History   Tobacco Use  Smoking Status Never Smoker  Smokeless Tobacco Never Used    Goals Met:  Independence with exercise equipment Exercise tolerated well No report of cardiac concerns or symptoms  Goals Unmet:  Not Applicable  Comments: Pt able to follow exercise  prescription today without complaint.  Will continue to monitor for progression.      Dr. Emily Filbert is Medical Director for [place] and [facility].

## 2019-06-27 NOTE — Patient Instructions (Signed)
Discharge Patient Instructions  Patient Details  Name: Brandon Kennedy MRN: [id number] Date of Birth: 07-04-1960 Referring Provider:  Alison Stalling, MD   Number of Visits: 29  Reason for Discharge:  Patient reached a stable level of exercise. Patient independent in their exercise. Patient has met program and personal goals.  Smoking History:  Social History   Tobacco Use  Smoking Status Never Smoker  Smokeless Tobacco Never Used    Diagnosis:  S/P TAVR (transcatheter aortic valve replacement)  Initial Exercise Prescription: Initial Exercise Prescription - 02/27/19 1000      Date of Initial Exercise RX and Referring Provider   Date  02/27/19    Referring Provider  Marisa Severin MD      Treadmill   MPH  3.3    Grade  1    Minutes  15    METs  3.71      REL-XR   Level  3    Speed  50    Minutes  15    METs  3.5      T5 Nustep   Level  4    SPM  80    Minutes  15    METs  3.5      Prescription Details   Frequency (times per week)  2    Duration  Progress to 30 minutes of continuous aerobic without signs/symptoms of physical distress      Intensity   THRR 40-80% of Max Heartrate  115-145    Ratings of Perceived Exertion  11-13    Perceived Dyspnea  0-4      Progression   Progression  Continue to progress workloads to maintain intensity without signs/symptoms of physical distress.      Resistance Training   Training Prescription  Yes    Weight  4 lbs    Reps  10-15       Discharge Exercise Prescription (Final Exercise Prescription Changes): Exercise Prescription Changes - 06/27/19 1000      Response to Exercise   Blood Pressure (Admit)  128/64    Blood Pressure (Exercise)  164/72    Blood Pressure (Exit)  126/70    Heart Rate (Admit)  76 bpm    Heart Rate (Exercise)  130 bpm    Heart Rate (Exit)  96 bpm    Rating of Perceived Exertion (Exercise)  15    Symptoms  none    Duration  Continue with 30 min of aerobic exercise without  signs/symptoms of physical distress.    Intensity  THRR unchanged      Progression   Progression  Continue to progress workloads to maintain intensity without signs/symptoms of physical distress.    Average METs  8.1      Resistance Training   Training Prescription  Yes    Weight  7 lb    Reps  10-15      Interval Training   Interval Training  Yes    Equipment  Treadmill;Elliptical    Comments  1 min on 1 min off      Treadmill   MPH  3.3    Grade  10    Minutes  15    METs  8.1      Elliptical   Level  10    Speed  4.5    Minutes  15      Home Exercise Plan   Plans to continue exercise at  Home (comment)    Frequency  Add  3 additional days to program exercise sessions.    Initial Home Exercises Provided  03/11/19       Functional Capacity: 6 Minute Walk    Row Name 02/27/19 1052 06/26/19 0756       6 Minute Walk   Phase  Initial  Discharge    Distance  1806 feet  1900 feet    Distance % Change  --  5.2 %    Distance Feet Change  --  94 ft    Walk Time  6 minutes  6 minutes    # of Rest Breaks  0  0    MPH  3.42  3.5    METS  5.13  4.71    RPE  14  15    VO2 Peak  17.97  16.48    Symptoms  No  No    Resting HR  85 bpm  57 bpm    Resting BP  142/86  126/72    Resting Oxygen Saturation   98 %  --    Exercise Oxygen Saturation  during 6 min walk  99 %  --    Max Ex. HR  128 bpm  115 bpm    Max Ex. BP  184/74 recheck BP prior to resistance training 164/72  138/70    2 Minute Post BP  142/74  --       Quality of Life: Quality of Life - 02/27/19 1115      Quality of Life   Select  Quality of Life      Quality of Life Scores   Health/Function Pre  27.83 %    Socioeconomic Pre  28.07 %    Psych/Spiritual Pre  28.29 %    Family Pre  30 %    GLOBAL Pre  28.29 %       Personal Goals: Goals established at orientation with interventions provided to work toward goal. Personal Goals and Risk Factors at Admission - 02/27/19 1112      Core  Components/Risk Factors/Patient Goals on Admission    Weight Management  Yes;Weight Loss    Intervention  Weight Management: Develop a combined nutrition and exercise program designed to reach desired caloric intake, while maintaining appropriate intake of nutrient and fiber, sodium and fats, and appropriate energy expenditure required for the weight goal.;Weight Management: Provide education and appropriate resources to help participant work on and attain dietary goals.    Admit Weight  202 lb 1.6 oz (91.7 kg)    Goal Weight: Short Term  195 lb (88.5 kg)    Goal Weight: Long Term  190 lb (86.2 kg)    Expected Outcomes  Short Term: Continue to assess and modify interventions until short term weight is achieved;Long Term: Adherence to nutrition and physical activity/exercise program aimed toward attainment of established weight goal;Weight Loss: Understanding of general recommendations for a balanced deficit meal plan, which promotes 1-2 lb weight loss per week and includes a negative energy balance of [phone] kcal/d;Understanding recommendations for meals to include 15-35% energy as protein, 25-35% energy from fat, 35-60% energy from carbohydrates, less than '200mg'$  of dietary cholesterol, 20-35 gm of total fiber daily;Understanding of distribution of calorie intake throughout the day with the consumption of 4-5 meals/snacks    Heart Failure  Yes    Intervention  Provide a combined exercise and nutrition program that is supplemented with education, support and counseling about heart failure. Directed toward relieving symptoms such as shortness of breath, decreased exercise  tolerance, and extremity edema.    Expected Outcomes  Improve functional capacity of life;Short term: Attendance in program 2-3 days a week with increased exercise capacity. Reported lower sodium intake. Reported increased fruit and vegetable intake. Reports medication compliance.;Short term: Daily weights obtained and reported for  increase. Utilizing diuretic protocols set by physician.;Long term: Adoption of self-care skills and reduction of barriers for early signs and symptoms recognition and intervention leading to self-care maintenance.    Lipids  Yes    Intervention  Provide education and support for participant on nutrition & aerobic/resistive exercise along with prescribed medications to achieve LDL '70mg'$ , HDL >'40mg'$ .    Expected Outcomes  Short Term: Participant states understanding of desired cholesterol values and is compliant with medications prescribed. Participant is following exercise prescription and nutrition guidelines.;Long Term: Cholesterol controlled with medications as prescribed, with individualized exercise RX and with personalized nutrition plan. Value goals: LDL < '70mg'$ , HDL > 40 mg.        Personal Goals Discharge: Goals and Risk Factor Review - 05/27/19 0746      Core Components/Risk Factors/Patient Goals Review   Personal Goals Review  Weight Management/Obesity;Improve shortness of breath with ADL's;Hypertension    Review  Reiley is doing well.  His weight is staying around 200-205 lbs.  He continues to eat better and drinks lots of water.  He continues to try to lose weight. His breathing is getting better and now its just the hard work that gets him.  His pressures have been good in class (he does not check at home).  He had a follow up last week and the doctor was pleased with his progress.    Expected Outcomes  Short: Continue to work on weight loss.  Long: Continue to monitor risk factors.       Exercise Goals and Review: Exercise Goals    Row Name 02/27/19 1109             Exercise Goals   Increase Physical Activity  Yes       Intervention  Provide advice, education, support and counseling about physical activity/exercise needs.;Develop an individualized exercise prescription for aerobic and resistive training based on initial evaluation findings, risk stratification, comorbidities and  participant's personal goals.       Expected Outcomes  Short Term: Attend rehab on a regular basis to increase amount of physical activity.;Long Term: Add in home exercise to make exercise part of routine and to increase amount of physical activity.;Long Term: Exercising regularly at least 3-5 days a week.       Increase Strength and Stamina  Yes       Intervention  Provide advice, education, support and counseling about physical activity/exercise needs.;Develop an individualized exercise prescription for aerobic and resistive training based on initial evaluation findings, risk stratification, comorbidities and participant's personal goals.       Expected Outcomes  Short Term: Increase workloads from initial exercise prescription for resistance, speed, and METs.;Short Term: Perform resistance training exercises routinely during rehab and add in resistance training at home;Long Term: Improve cardiorespiratory fitness, muscular endurance and strength as measured by increased METs and functional capacity (6MWT)       Able to understand and use rate of perceived exertion (RPE) scale  Yes       Intervention  Provide education and explanation on how to use RPE scale       Expected Outcomes  Short Term: Able to use RPE daily in rehab to express subjective intensity level;Long Term:  Able to use RPE to guide intensity level when exercising independently       Knowledge and understanding of Target Heart Rate Range (THRR)  Yes       Intervention  Provide education and explanation of THRR including how the numbers were predicted and where they are located for reference       Expected Outcomes  Short Term: Able to state/look up THRR;Short Term: Able to use daily as guideline for intensity in rehab;Long Term: Able to use THRR to govern intensity when exercising independently       Able to check pulse independently  Yes       Intervention  Provide education and demonstration on how to check pulse in carotid and radial  arteries.;Review the importance of being able to check your own pulse for safety during independent exercise       Expected Outcomes  Short Term: Able to explain why pulse checking is important during independent exercise;Long Term: Able to check pulse independently and accurately       Understanding of Exercise Prescription  Yes       Intervention  Provide education, explanation, and written materials on patient's individual exercise prescription       Expected Outcomes  Long Term: Able to explain home exercise prescription to exercise independently;Short Term: Able to explain program exercise prescription          Exercise Goals Re-Evaluation: Exercise Goals Re-Evaluation    [place] Name 03/04/19 0800 03/07/19 0745 03/13/19 0740 03/18/19 1520 04/01/19 1545     Exercise Goal Re-Evaluation   Exercise Goals Review  Increase Physical Activity;Increase Strength and Stamina;Able to understand and use rate of perceived exertion (RPE) scale;Able to understand and use Dyspnea scale;Knowledge and understanding of Target Heart Rate Range (THRR);Able to check pulse independently;Understanding of Exercise Prescription  Increase Physical Activity;Increase Strength and Stamina;Able to understand and use rate of perceived exertion (RPE) scale;Able to understand and use Dyspnea scale;Knowledge and understanding of Target Heart Rate Range (THRR);Able to check pulse independently;Understanding of Exercise Prescription  Increase Physical Activity;Increase Strength and Stamina;Understanding of Exercise Prescription  Increase Physical Activity;Increase Strength and Stamina;Understanding of Exercise Prescription  Increase Physical Activity;Increase Strength and Stamina;Understanding of Exercise Prescription   Comments  Reviewed RPE scale, THR and program prescription with pt today.  Pt voiced understanding and was given a copy of goals to take home.  Marshun is tolerating exercise well.  Staff will continue to monitor progress.   Marquavion is doing well in rehab. He is off to a good start and already walking at home.  He is starting to recover his strength and stamina.  He is not doing anything too strenuous yet.  He is fatigued after work for standing and walking for 12 hours.  He naps in the afternoons.  Raliegh continues to do well in rehab.  He is doing well with his workloads and should be ready to start to move them up!!  We will continue to monitor his progress.  Lucan has been doing well in rehab.  He has increased almost all of his workloads!  We will continue to monitor his progression.   Expected Outcomes  Short: Use RPE daily to regulate intensity. Long: Follow program prescription in THR.  Short - attend consistently Long - increase overall MET level  Short: Continue to walk on off days.  Long: Continue to increase stamina.  Short: Begin to increase workloads.  Long: Continue to improve stamina.  Short: Review home exercise guidelines.  Long: Continue to improve stamina.   [place] Name 04/03/19 6629 04/15/19 1406 04/29/19 0807 05/13/19 0930 05/27/19 0743     Exercise Goal Re-Evaluation   Exercise Goals Review  Increase Physical Activity;Increase Strength and Stamina;Understanding of Exercise Prescription  Increase Physical Activity;Increase Strength and Stamina;Able to understand and use rate of perceived exertion (RPE) scale;Knowledge and understanding of Target Heart Rate Range (THRR);Able to check pulse independently;Understanding of Exercise Prescription  Increase Physical Activity;Increase Strength and Stamina;Knowledge and understanding of Target Heart Rate Range (THRR)  Increase Physical Activity;Increase Strength and Stamina;Able to understand and use rate of perceived exertion (RPE) scale;Able to understand and use Dyspnea scale;Knowledge and understanding of Target Heart Rate Range (THRR);Able to check pulse independently;Understanding of Exercise Prescription  Increase Physical Activity;Increase Strength and  Stamina;Understanding of Exercise Prescription   Comments  Donevin continues to do well in rehab. He is working hard in class and exercising at home. He also stays active by gardening.  He has noticed that he is now able to do more in his garden without getting as short of breath as during the summer.  Kamali is continuing to attend consistently and work in correct THR and RPE range.  He also exercsises at home.  Dail works 3 days a week for 12 hours shifts. He walks at home and does weights twice a week. He does not exercise on the weekend but he is walking all day long. Sometimes when he is out gardening he over does it but feels like he can do alot more than he used to. He knows his target heart rate and knows how to check it manually.  Delaney does intervals during cardio sessions and has increased overall MET level.  Staff will monitor progress.  Hikeem is doing well in rehab.  He is enjoying intervals.  He is walking at home and using weights.  He has noticed that Mondays are tough after the weekend, but gets back into the flow by Tuesday and Wednesday.  He feels that he has his strength back.   Expected Outcomes  Short: Continue to exericse at home on off day.  Long: Continue to work on stamina in garden.  Short - continue to be consistent and increase workloads Long - increase overall MET level  Short: continue to exercise. Long: graduate [employer].  Short - continue interval training Long : complete HT  Short: Continut to add in more exercise at home.  Long: Continue to improve stamina.   [place] Name 06/10/19 1432 06/27/19 1022           Exercise Goal Re-Evaluation   Exercise Goals Review  Increase Physical Activity;Increase Strength and Stamina;Able to understand and use rate of perceived exertion (RPE) scale;Knowledge and understanding of Target Heart Rate Range (THRR);Able to check pulse independently;Understanding of Exercise Prescription  Increase Physical Activity;Increase Strength and  Stamina;Understanding of Exercise Prescription      Comments  Kairi has increased intervals to 9% on TM.  He continues to be consistent in attendance and working on intervals  Ritter improved his post 6MWT by 30f! He is graduating early due to COVID-19 numbers.  He is has continued to increase his intervals on both the treadmill and elliptical.  He really enjoys the ellipitcal. He is planning to continue to exericse by walking at home and may look into purchasing an elliptical.      Expected Outcomes  Short :  continue intervals Long - maintain fitness gains on his own  Short: Continue to exercise on  his own. Long: Maintain his new healthier lifestyle.         Nutrition & Weight - Outcomes: Pre Biometrics - 02/27/19 1112      Pre Biometrics   Height  5' 11.2" (1.808 m)    Weight  202 lb 1.6 oz (91.7 kg)    BMI (Calculated)  28.04    Single Leg Stand  30 seconds      Post Biometrics - 06/26/19 0757       Post  Biometrics   Height  5' 11.2" (1.808 m)    Weight  203 lb 14.4 oz (92.5 kg)    BMI (Calculated)  28.29    Single Leg Stand  30 seconds       Nutrition: Nutrition Therapy & Goals - 02/27/19 1048      Nutrition Therapy   Diet  low Na, HH diet    Protein (specify units)  95g    Fiber  30 grams    Whole Grain Foods  3 servings    Saturated Fats  12 max. grams    Fruits and Vegetables  5 servings/day    Sodium  1.5 grams      Personal Nutrition Goals   Nutrition Goal  ST: take note of the ranges of Na you have daily  LT: have and improved EF    Comments  Pt and wife used a lot of diet culture buzz words "clean eating", "real food", "good food", "bad food", "chemicals". This RD discussed some myths and facts about food and food balance. Pt reports eating raisin bran with whole milk, either leftovers, whole grain sandwich with [place], or microwave meal (wife reports its a healthy one from the co-op), snacks are usually dry roasted/unsalted nuts or fruit, dinner is either chicken  (red meat 2x/week) or plant protein like bean salad, whole grains, and vegetables. Pt and wife report eating mostly fresh foods and food from their garden. They choose healthy fats like olive oil and will use butter (but not as much). on weekends pt will have an egg with an english muffin. Pt wife reports cooking almost all the food from scratch and using salt which she has tried to cut back. Disucssed HH eating, low sodium eating, and fiber. Pt reports he likes what he is eating.      Intervention Plan   Intervention  Prescribe, educate and counsel regarding individualized specific dietary modifications aiming towards targeted core components such as weight, hypertension, lipid management, diabetes, heart failure and other comorbidities.;Nutrition handout(s) given to patient.    Expected Outcomes  Short Term Goal: Understand basic principles of dietary content, such as calories, fat, sodium, cholesterol and nutrients.;Short Term Goal: A plan has been developed with personal nutrition goals set during dietitian appointment.;Long Term Goal: Adherence to prescribed nutrition plan.       Nutrition Discharge: Nutrition Assessments - 02/27/19 1048      MEDFICTS Scores   Pre Score  32       Education Questionnaire Score:   Goals reviewed with patient; copy given to patient.

## 2019-07-03 ENCOUNTER — Encounter: Payer: PRIVATE HEALTH INSURANCE | Admitting: *Deleted

## 2019-07-03 ENCOUNTER — Other Ambulatory Visit: Payer: Self-pay

## 2019-07-03 DIAGNOSIS — Z952 Presence of prosthetic heart valve: Secondary | ICD-10-CM

## 2019-07-03 NOTE — Progress Notes (Signed)
Virtual call completed today. Calls are done every week that patient is not attending an onsite exercise session during the COVID 19 PHE Crisis.   Today's call included review of :their home exercise    Taj is using his new [address]  Daily ,except workdays,with 15 min of weights and stretching while he is not in class.   No voiced concerns today.

## 2019-07-16 ENCOUNTER — Telehealth: Payer: Self-pay | Admitting: *Deleted

## 2019-07-16 ENCOUNTER — Encounter: Payer: Self-pay | Admitting: *Deleted

## 2019-07-16 DIAGNOSIS — Z952 Presence of prosthetic heart valve: Secondary | ICD-10-CM

## 2019-07-16 NOTE — Progress Notes (Signed)
Cardiac Individual Treatment Plan  Patient Details  Name: Brandon Kennedy MRN: [id number] Date of Birth: 07/06/1960 Referring Provider:     Cardiac Rehab from 02/27/2019 in [facility]  Referring Provider  Vavalle, John MD      Initial Encounter Date:    Cardiac Rehab from 02/27/2019 in [facility]  Date  02/27/19      Visit Diagnosis: S/P TAVR (transcatheter aortic valve replacement)  Patient's Home Medications on Admission:  Current Outpatient Medications:  .  acetaminophen (TYLENOL) 500 MG tablet, Take by mouth., Disp: , Rfl:  .  aspirin EC 81 MG tablet, Take by mouth., Disp: , Rfl:  .  losartan (COZAAR) 25 MG tablet, Take by mouth., Disp: , Rfl:  .  metoprolol succinate (TOPROL-XL) 25 MG 24 hr tablet, Take by mouth., Disp: , Rfl:   Past Medical History: No past medical history on file.  Tobacco Use: Social History   Tobacco Use  Smoking Status Never Smoker  Smokeless Tobacco Never Used    Labs: Recent Review Flowsheet Data    There is no flowsheet data to display.       Exercise Target Goals: Exercise Program Goal: Individual exercise prescription set using results from initial 6 min walk test and THRR while considering  patient's activity barriers and safety.   Exercise Prescription Goal: Initial exercise prescription builds to 30-45 minutes a day of aerobic activity, 2-3 days per week.  Home exercise guidelines will be given to patient during program as part of exercise prescription that the participant will acknowledge.  Activity Barriers & Risk Stratification: Activity Barriers & Cardiac Risk Stratification - 02/27/19 1054      Activity Barriers & Cardiac Risk Stratification   Activity Barriers  Arthritis;Joint Problems;Decreased Ventricular Function;Muscular Weakness   bilateral knee pain   Cardiac Risk Stratification  Low       6 Minute Walk: 6 Minute Walk    Row Name 02/27/19 1052 06/26/19 0756        6 Minute Walk   Phase  Initial  Discharge    Distance  1806 feet  1900 feet    Distance % Change  --  5.2 %    Distance Feet Change  --  94 ft    Walk Time  6 minutes  6 minutes    # of Rest Breaks  0  0    MPH  3.42  3.5    METS  5.13  4.71    RPE  14  15    VO2 Peak  17.97  16.48    Symptoms  No  No    Resting HR  85 bpm  57 bpm    Resting BP  142/86  126/72    Resting Oxygen Saturation   98 %  --    Exercise Oxygen Saturation  during 6 min walk  99 %  --    Max Ex. HR  128 bpm  115 bpm    Max Ex. BP  184/74 recheck BP prior to resistance training 164/72  138/70    2 Minute Post BP  142/74  --       Oxygen Initial Assessment:   Oxygen Re-Evaluation:   Oxygen Discharge (Final Oxygen Re-Evaluation):   Initial Exercise Prescription: Initial Exercise Prescription - 02/27/19 1000      Date of Initial Exercise RX and Referring Provider   Date  02/27/19    Referring Provider  Vavalle, John MD        Treadmill   MPH  3.3    Grade  1    Minutes  15    METs  3.71      REL-XR   Level  3    Speed  50    Minutes  15    METs  3.5      T5 Nustep   Level  4    SPM  80    Minutes  15    METs  3.5      Prescription Details   Frequency (times per week)  2    Duration  Progress to 30 minutes of continuous aerobic without signs/symptoms of physical distress      Intensity   THRR 40-80% of Max Heartrate  115-145    Ratings of Perceived Exertion  11-13    Perceived Dyspnea  0-4      Progression   Progression  Continue to progress workloads to maintain intensity without signs/symptoms of physical distress.      Resistance Training   Training Prescription  Yes    Weight  4 lbs    Reps  10-15       Perform Capillary Blood Glucose checks as needed.  Exercise Prescription Changes: Exercise Prescription Changes    Row Name 02/27/19 1100 03/07/19 0700 03/11/19 1400 03/19/19 1400 04/02/19 1000     Response to Exercise   Blood Pressure (Admit)  142/86  128/64  --   94/60  126/66   Blood Pressure (Exercise)  184/74 rck 164/72  146/62  --  140/78  144/64   Blood Pressure (Exit)  142/74  124/70  --  94/56  118/64   Heart Rate (Admit)  85 bpm  68 bpm  --  68 bpm  63 bpm   Heart Rate (Exercise)  128 bpm  127 bpm  --  107 bpm  102 bpm   Heart Rate (Exit)  92 bpm  --  --  71 bpm  83 bpm   Oxygen Saturation (Admit)  98 %  --  --  --  --   Oxygen Saturation (Exercise)  99 %  --  --  --  --   Rating of Perceived Exertion (Exercise)  14  13  --  13  13   Symptoms  none  none  --  none  none   Comments  walk test results  --  --  --  --   Duration  --  Continue with 30 min of aerobic exercise without signs/symptoms of physical distress.  --  Continue with 30 min of aerobic exercise without signs/symptoms of physical distress.  Continue with 30 min of aerobic exercise without signs/symptoms of physical distress.   Intensity  --  THRR unchanged  --  THRR unchanged  THRR unchanged     Progression   Progression  --  --  --  Continue to progress workloads to maintain intensity without signs/symptoms of physical distress.  Continue to progress workloads to maintain intensity without signs/symptoms of physical distress.   Average METs  --  --  --  3.69  3.33     Resistance Training   Training Prescription  --  Yes  --  Yes  Yes   Weight  --  4 lb  --  5 lbs  5 lbs   Reps  --  10-15  --  10-15  10-15     Interval Training   Interval Training  --  No  --  --    No     Treadmill   MPH  --  3  --  3.3  3.3   Grade  --  1  --  1  1   Minutes  --  15  --  15  15   METs  --  3.71  --  3.98  3.98     REL-XR   Level  --  3  --  8  8   Speed  --  50  --  --  --   Minutes  --  15  --  15  15   METs  --  2.8  --  4.8  3.1     T5 Nustep   Level  --  4  --  5  5   SPM  --  80  --  --  --   Minutes  --  15  --  15  15   METs  --  2.7  --  2.3  2.9     Home Exercise Plan   Plans to continue exercise at  --  --  Home (comment)  Home (comment)  Home (comment)    Frequency  --  --  Add 3 additional days to program exercise sessions.  Add 3 additional days to program exercise sessions.  Add 3 additional days to program exercise sessions.   Initial Home Exercises Provided  --  --  03/11/19  03/11/19  03/11/19   Row Name 04/15/19 1400 04/30/19 1200 05/13/19 0900 05/28/19 1500 06/10/19 1400     Response to Exercise   Blood Pressure (Admit)  110/60  122/54  126/58  134/70  132/74   Blood Pressure (Exercise)  142/72  132/62  150/64  142/60  144/74   Blood Pressure (Exit)  104/64  130/64  128/74  136/70  122/64   Heart Rate (Admit)  57 bpm  65 bpm  66 bpm  120 bpm  63 bpm   Heart Rate (Exercise)  123 bpm  117 bpm  123 bpm  128 bpm  130 bpm   Heart Rate (Exit)  72 bpm  77 bpm  82 bpm  93 bpm  94 bpm   Rating of Perceived Exertion (Exercise)  13  15  15  15  15   Symptoms  none  none  none  none  none   Duration  Continue with 30 min of aerobic exercise without signs/symptoms of physical distress.  Continue with 30 min of aerobic exercise without signs/symptoms of physical distress.  Continue with 30 min of aerobic exercise without signs/symptoms of physical distress.  Continue with 30 min of aerobic exercise without signs/symptoms of physical distress.  Continue with 30 min of aerobic exercise without signs/symptoms of physical distress.   Intensity  THRR unchanged  THRR unchanged  THRR unchanged  THRR unchanged  THRR unchanged     Progression   Progression  Continue to progress workloads to maintain intensity without signs/symptoms of physical distress.  Continue to progress workloads to maintain intensity without signs/symptoms of physical distress.  Continue to progress workloads to maintain intensity without signs/symptoms of physical distress.  Continue to progress workloads to maintain intensity without signs/symptoms of physical distress.  Continue to progress workloads to maintain intensity without signs/symptoms of physical distress.   Average METs  3.6   5.87  4.7  6.9  6     Resistance Training   Training Prescription  Yes  Yes  Yes  Yes    Yes   Weight  5 lb  7 lb  7 lb  7 lb  7 lb   Reps  10-15  10-15  10-15  10-15  10-15     Interval Training   Interval Training  No  Yes  Yes  Yes  --   Equipment  --  Treadmill;REL-XR;T5 Nustep  Treadmill;Recumbant Elliptical;T5 Nustep  Treadmill;Recumbant Elliptical;T5 Nustep  --   Comments  --  2 min off 30 sec on (1:1 on TM)  2 mon off 30 sec on  2 min off 30 sec on  --     Treadmill   MPH  3.3  3.3  --  3.3  3.3   Grade  1  7  --  7.5  9   Minutes  15  15  --  15  15   METs  3.98  6.4  --  6.9  6.9     Elliptical   Level  --  --  --  2  3   Speed  --  --  --  4  4   Minutes  --  --  --  15  15     REL-XR   Level  8  10  10  --  --   Minutes  15  15  15  --  --   METs  4.8  7.4  6.7  --  --     T5 Nustep   Level  6  6  7  --  --   Minutes  15  15  15  --  --   METs  2.4  3.5  2.7  --  --     Home Exercise Plan   Plans to continue exercise at  Home (comment)  Home (comment)  Home (comment)  Home (comment)  Home (comment)   Frequency  Add 3 additional days to program exercise sessions.  Add 3 additional days to program exercise sessions.  Add 3 additional days to program exercise sessions.  Add 3 additional days to program exercise sessions.  Add 3 additional days to program exercise sessions.   Initial Home Exercises Provided  --  03/11/19  03/11/19  03/11/19  03/11/19   Row Name 06/27/19 1000 07/09/19 1400           Response to Exercise   Blood Pressure (Admit)  128/64  126/72      Blood Pressure (Exercise)  164/72  138/70      Blood Pressure (Exit)  126/70  136/74      Heart Rate (Admit)  76 bpm  61 bpm      Heart Rate (Exercise)  130 bpm  137 bpm      Heart Rate (Exit)  96 bpm  89 bpm      Rating of Perceived Exertion (Exercise)  15  15      Symptoms  none  none      Duration  Continue with 30 min of aerobic exercise without signs/symptoms of physical distress.  Continue with  30 min of aerobic exercise without signs/symptoms of physical distress.      Intensity  THRR unchanged  THRR unchanged        Progression   Progression  Continue to progress workloads to maintain intensity without signs/symptoms of physical distress.  Continue to progress workloads to maintain intensity without signs/symptoms of physical distress.      Average METs  8.1  --          Resistance Training   Training Prescription  Yes  Yes      Weight  7 lb  7 lb      Reps  10-15  10-15        Interval Training   Interval Training  Yes  Yes      Equipment  Treadmill;Elliptical  Treadmill;Elliptical      Comments  1 min on 1 min off  --        Treadmill   MPH  3.3  3.3      Grade  10  3 up to 10       Minutes  15  15      METs  8.1  4.89        Elliptical   Level  10  3      Speed  4.5  4      Minutes  15  15        Home Exercise Plan   Plans to continue exercise at  Home (comment)  --      Frequency  Add 3 additional days to program exercise sessions.  --      Initial Home Exercises Provided  03/11/19  --         Exercise Comments: Exercise Comments    Row Name 03/04/19 0800 07/03/19 0854         Exercise Comments  First full day of exercise!  Patient was oriented to gym and equipment including functions, settings, policies, and procedures.  Patient's individual exercise prescription and treatment plan were reviewed.  All starting workloads were established based on the results of the 6 minute walk test done at initial orientation visit.  The plan for exercise progression was also introduced and progression will be customized based on patient's performance and goals.  Virtual call completed today. Calls are done every week that patient is not attending an onsite exercise session during the COVID 19 PHE Crisis.   Today's call included review of :their home exercise    Brandon Kennedy is using his new [address]  Daily ,except workdays,with 15 min of weights and stretching while he is  not in class.   No voiced concerns today.         Exercise Goals and Review: Exercise Goals    Row Name 02/27/19 1109             Exercise Goals   Increase Physical Activity  Yes       Intervention  Provide advice, education, support and counseling about physical activity/exercise needs.;Develop an individualized exercise prescription for aerobic and resistive training based on initial evaluation findings, risk stratification, comorbidities and participant's personal goals.       Expected Outcomes  Short Term: Attend rehab on a regular basis to increase amount of physical activity.;Long Term: Add in home exercise to make exercise part of routine and to increase amount of physical activity.;Long Term: Exercising regularly at least 3-5 days a week.       Increase Strength and Stamina  Yes       Intervention  Provide advice, education, support and counseling about physical activity/exercise needs.;Develop an individualized exercise prescription for aerobic and resistive training based on initial evaluation findings, risk stratification, comorbidities and participant's personal goals.       Expected Outcomes  Short Term: Increase workloads from initial exercise prescription for resistance, speed, and METs.;Short Term: Perform resistance training exercises routinely during rehab and add in resistance training at home;Long   Term: Improve cardiorespiratory fitness, muscular endurance and strength as measured by increased METs and functional capacity (6MWT)       Able to understand and use rate of perceived exertion (RPE) scale  Yes       Intervention  Provide education and explanation on how to use RPE scale       Expected Outcomes  Short Term: Able to use RPE daily in rehab to express subjective intensity level;Long Term:  Able to use RPE to guide intensity level when exercising independently       Knowledge and understanding of Target Heart Rate Range (THRR)  Yes       Intervention  Provide  education and explanation of THRR including how the numbers were predicted and where they are located for reference       Expected Outcomes  Short Term: Able to state/look up THRR;Short Term: Able to use daily as guideline for intensity in rehab;Long Term: Able to use THRR to govern intensity when exercising independently       Able to check pulse independently  Yes       Intervention  Provide education and demonstration on how to check pulse in carotid and radial arteries.;Review the importance of being able to check your own pulse for safety during independent exercise       Expected Outcomes  Short Term: Able to explain why pulse checking is important during independent exercise;Long Term: Able to check pulse independently and accurately       Understanding of Exercise Prescription  Yes       Intervention  Provide education, explanation, and written materials on patient's individual exercise prescription       Expected Outcomes  Long Term: Able to explain home exercise prescription to exercise independently;Short Term: Able to explain program exercise prescription          Exercise Goals Re-Evaluation : Exercise Goals Re-Evaluation    Row Name 03/04/19 0800 03/07/19 0745 03/13/19 0740 03/18/19 1520 04/01/19 1545     Exercise Goal Re-Evaluation   Exercise Goals Review  Increase Physical Activity;Increase Strength and Stamina;Able to understand and use rate of perceived exertion (RPE) scale;Able to understand and use Dyspnea scale;Knowledge and understanding of Target Heart Rate Range (THRR);Able to check pulse independently;Understanding of Exercise Prescription  Increase Physical Activity;Increase Strength and Stamina;Able to understand and use rate of perceived exertion (RPE) scale;Able to understand and use Dyspnea scale;Knowledge and understanding of Target Heart Rate Range (THRR);Able to check pulse independently;Understanding of Exercise Prescription  Increase Physical Activity;Increase  Strength and Stamina;Understanding of Exercise Prescription  Increase Physical Activity;Increase Strength and Stamina;Understanding of Exercise Prescription  Increase Physical Activity;Increase Strength and Stamina;Understanding of Exercise Prescription   Comments  Reviewed RPE scale, THR and program prescription with pt today.  Pt voiced understanding and was given a copy of goals to take home.  Brandon Kennedy is tolerating exercise well.  Staff will continue to monitor progress.  Brandon Kennedy is doing well in rehab. He is off to a good start and already walking at home.  He is starting to recover his strength and stamina.  He is not doing anything too strenuous yet.  He is fatigued after work for standing and walking for 12 hours.  He naps in the afternoons.  Brandon Kennedy continues to do well in rehab.  He is doing well with his workloads and should be ready to start to move them up!!  We will continue to monitor his progress.  Brandon Kennedy has been doing well in   rehab.  He has increased almost all of his workloads!  We will continue to monitor his progression.   Expected Outcomes  Short: Use RPE daily to regulate intensity. Long: Follow program prescription in THR.  Short - attend consistently Long - increase overall MET level  Short: Continue to walk on off days.  Long: Continue to increase stamina.  Short: Begin to increase workloads.  Long: Continue to improve stamina.  Short: Review home exercise guidelines.  Long: Continue to improve stamina.   [place] Name 04/03/19 0923 04/15/19 1406 04/29/19 0807 05/13/19 0930 05/27/19 0743     Exercise Goal Re-Evaluation   Exercise Goals Review  Increase Physical Activity;Increase Strength and Stamina;Understanding of Exercise Prescription  Increase Physical Activity;Increase Strength and Stamina;Able to understand and use rate of perceived exertion (RPE) scale;Knowledge and understanding of Target Heart Rate Range (THRR);Able to check pulse independently;Understanding of Exercise Prescription  Increase  Physical Activity;Increase Strength and Stamina;Knowledge and understanding of Target Heart Rate Range (THRR)  Increase Physical Activity;Increase Strength and Stamina;Able to understand and use rate of perceived exertion (RPE) scale;Able to understand and use Dyspnea scale;Knowledge and understanding of Target Heart Rate Range (THRR);Able to check pulse independently;Understanding of Exercise Prescription  Increase Physical Activity;Increase Strength and Stamina;Understanding of Exercise Prescription   Comments  Brandon Kennedy continues to do well in rehab. He is working hard in class and exercising at home. He also stays active by gardening.  He has noticed that he is now able to do more in his garden without getting as short of breath as during the summer.  Brandon Kennedy is continuing to attend consistently and work in correct THR and RPE range.  He also exercsises at home.  Brandon Kennedy works 3 days a week for 12 hours shifts. He walks at home and does weights twice a week. He does not exercise on the weekend but he is walking all day long. Sometimes when he is out gardening he over does it but feels like he can do alot more than he used to. He knows his target heart rate and knows how to check it manually.  Brandon Kennedy does intervals during cardio sessions and has increased overall MET level.  Staff will monitor progress.  Brandon Kennedy is doing well in rehab.  He is enjoying intervals.  He is walking at home and using weights.  He has noticed that Mondays are tough after the weekend, but gets back into the flow by Tuesday and Wednesday.  He feels that he has his strength back.   Expected Outcomes  Short: Continue to exericse at home on off day.  Long: Continue to work on stamina in garden.  Short - continue to be consistent and increase workloads Long - increase overall MET level  Short: continue to exercise. Long: graduate [employer].  Short - continue interval training Long : complete HT  Short: Continut to add in more exercise at home.  Long:  Continue to improve stamina.   [place] Name 06/10/19 1432 06/27/19 1022 07/09/19 1453         Exercise Goal Re-Evaluation   Exercise Goals Review  Increase Physical Activity;Increase Strength and Stamina;Able to understand and use rate of perceived exertion (RPE) scale;Knowledge and understanding of Target Heart Rate Range (THRR);Able to check pulse independently;Understanding of Exercise Prescription  Increase Physical Activity;Increase Strength and Stamina;Understanding of Exercise Prescription  Increase Physical Activity;Increase Strength and Stamina;Able to understand and use rate of perceived exertion (RPE) scale;Able to understand and use Dyspnea scale;Knowledge and understanding of Target Heart Rate  Range (THRR);Able to check pulse independently;Understanding of Exercise Prescription     Comments  Brandon Kennedy has increased intervals to 9% on TM.  He continues to be consistent in attendance and working on intervals  Brandon Kennedy improved his post 6MWT by 94ft! He is graduating early due to COVID-19 numbers.  He is has continued to increase his intervals on both the treadmill and elliptical.  He really enjoys the ellipitcal. He is planning to continue to exericse by walking at home and may look into purchasing an elliptical.  --     Expected Outcomes  Short :  continue intervals Long - maintain fitness gains on his own  Short: Continue to exercise on his own. Long: Maintain his new healthier lifestyle.  --        Discharge Exercise Prescription (Final Exercise Prescription Changes): Exercise Prescription Changes - 07/09/19 1400      Response to Exercise   Blood Pressure (Admit)  126/72    Blood Pressure (Exercise)  138/70    Blood Pressure (Exit)  136/74    Heart Rate (Admit)  61 bpm    Heart Rate (Exercise)  137 bpm    Heart Rate (Exit)  89 bpm    Rating of Perceived Exertion (Exercise)  15    Symptoms  none    Duration  Continue with 30 min of aerobic exercise without signs/symptoms of physical distress.     Intensity  THRR unchanged      Progression   Progression  Continue to progress workloads to maintain intensity without signs/symptoms of physical distress.      Resistance Training   Training Prescription  Yes    Weight  7 lb    Reps  10-15      Interval Training   Interval Training  Yes    Equipment  Treadmill;Elliptical      Treadmill   MPH  3.3    Grade  3   up to 10    Minutes  15    METs  4.89      Elliptical   Level  3    Speed  4    Minutes  15       Nutrition:  Target Goals: Understanding of nutrition guidelines, daily intake of sodium <1500mg, cholesterol <200mg, calories 30% from fat and 7% or less from saturated fats, daily to have 5 or more servings of fruits and vegetables.  Biometrics: Pre Biometrics - 02/27/19 1112      Pre Biometrics   Height  5' 11.2" (1.808 m)    Weight  202 lb 1.6 oz (91.7 kg)    BMI (Calculated)  28.04    Single Leg Stand  30 seconds      Post Biometrics - 06/26/19 0757       Post  Biometrics   Height  5' 11.2" (1.808 m)    Weight  203 lb 14.4 oz (92.5 kg)    BMI (Calculated)  28.29    Single Leg Stand  30 seconds       Nutrition Therapy Plan and Nutrition Goals: Nutrition Therapy & Goals - 02/27/19 1048      Nutrition Therapy   Diet  low Na, HH diet    Protein (specify units)  95g    Fiber  30 grams    Whole Grain Foods  3 servings    Saturated Fats  12 max. grams    Fruits and Vegetables  5 servings/day    Sodium  1.5 grams        Personal Nutrition Goals   Nutrition Goal  ST: take note of the ranges of Na you have daily  LT: have and improved EF    Comments  Pt and wife used a lot of diet culture buzz words "clean eating", "real food", "good food", "bad food", "chemicals". This RD discussed some myths and facts about food and food balance. Pt reports eating raisin bran with whole milk, either leftovers, whole grain sandwich with [place], or microwave meal (wife reports its a healthy one from the co-op), snacks  are usually dry roasted/unsalted nuts or fruit, dinner is either chicken (red meat 2x/week) or plant protein like bean salad, whole grains, and vegetables. Pt and wife report eating mostly fresh foods and food from their garden. They choose healthy fats like olive oil and will use butter (but not as much). on weekends pt will have an egg with an english muffin. Pt wife reports cooking almost all the food from scratch and using salt which she has tried to cut back. Disucssed HH eating, low sodium eating, and fiber. Pt reports he likes what he is eating.      Intervention Plan   Intervention  Prescribe, educate and counsel regarding individualized specific dietary modifications aiming towards targeted core components such as weight, hypertension, lipid management, diabetes, heart failure and other comorbidities.;Nutrition handout(s) given to patient.    Expected Outcomes  Short Term Goal: Understand basic principles of dietary content, such as calories, fat, sodium, cholesterol and nutrients.;Short Term Goal: A plan has been developed with personal nutrition goals set during dietitian appointment.;Long Term Goal: Adherence to prescribed nutrition plan.       Nutrition Assessments: Nutrition Assessments - 02/27/19 1048      MEDFICTS Scores   Pre Score  32       Nutrition Goals Re-Evaluation: Nutrition Goals Re-Evaluation    Row Name 04/22/19 1131 05/13/19 0812 05/29/19 0814 06/26/19 0802       Goals   Nutrition Goal  ST: take note of the ranges of Na you have daily  LT: have and improved EF  ST: Be mindful of hunger scale LT: have improved EF  ST: Continue with current chnages LT: have improved EF  ST: Continue with current changes LT: have improved EF    Comment  Continue with current changes  more aware of sodium, now uses lower salt in cooking. Talked about frozen meals over the weekend with wife. Thinks he eats too much on the weekend. bean and quinoa salads. using spices rather than salt.  wife made broccoli salad.  Pt reports doing well with diet, really enjoys wifes broccoli salad that has a variety of fruits and vegetables and reports it is very filling. Pt reports listening to his hunger cues and monitoring his Na. Pt feels he didn't eat so HH this weekend due to neighbor prepparing homemade smoked BBQ- discussed balance and overall habits more important than individual day (pt reports that he does not have this normally).  Pt reports doing well with diet, really enjoys wifes broccoli salad that has a variety of fruits and vegetables and reports it is very filling. Pt reports that he feels he eats too much, discussed honoring hunger and the hunger/fullness scale as well as practical hunger.  Pt reports no questions at this time.    Expected Outcome  ST: take note of the ranges of Na you have daily  LT: have and improved EF  ST: Be mindful of hunger scale LT: have improved EF  ST: Continue with current chnages LT: have improved EF  ST: Continue with current changes LT: have improved EF       Nutrition Goals Discharge (Final Nutrition Goals Re-Evaluation): Nutrition Goals Re-Evaluation - 06/26/19 0802      Goals   Nutrition Goal  ST: Continue with current changes LT: have improved EF    Comment  Pt reports doing well with diet, really enjoys wifes broccoli salad that has a variety of fruits and vegetables and reports it is very filling. Pt reports that he feels he eats too much, discussed honoring hunger and the hunger/fullness scale as well as practical hunger.  Pt reports no questions at this time.    Expected Outcome  ST: Continue with current changes LT: have improved EF       Psychosocial: Target Goals: Acknowledge presence or absence of significant depression and/or stress, maximize coping skills, provide positive support system. Participant is able to verbalize types and ability to use techniques and skills needed for reducing stress and depression.   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 He is glad to be rid of his LifeVest, but wanting to make sure his heart is okay. He and his wife are making lifestyle changes and he wants to get more education regarding his health's future.      Family Dynamics   Good Support System?  Yes      Barriers   Psychosocial barriers to participate in program  There are no identifiable barriers or psychosocial needs.;The patient should benefit from training in stress management and relaxation.      Screening Interventions   Interventions  Encouraged to exercise;To provide support and resources with identified psychosocial needs;Provide feedback about the scores to participant    Expected Outcomes  Short Term goal: Utilizing psychosocial counselor, staff and physician to assist with identification of specific Stressors or current issues interfering with healing process. Setting desired goal for each stressor or current issue identified.;Long Term Goal: Stressors or current issues are controlled or eliminated.;Short Term goal: Identification and review with participant of any Quality of Life or Depression concerns found by scoring the questionnaire.;Long Term goal: The participant improves quality of Life and PHQ9 Scores as seen by post scores and/or verbalization of changes       Quality of Life Scores:  Quality of Life - 02/27/19 1115      Quality of Life   Select  Quality of Life      Quality of Life Scores   Health/Function Pre  27.83 %    Socioeconomic Pre  28.07 %    Psych/Spiritual Pre  28.29 %    Family Pre  30 %    GLOBAL Pre  28.29 %      Scores of 19 and below usually indicate a poorer quality of life in these areas.  A difference of  2-3 points is a clinically meaningful difference.  A difference of 2-3  points in the total score of the Quality of Life Index has been associated with significant improvement in overall quality of life, self-image, physical symptoms, and general health in studies assessing change in quality of life.  PHQ-9: Recent Review Flowsheet Data    Depression screen [facility] 2/9 02/27/2019  Decreased Interest 0   Down, Depressed, Hopeless 0   PHQ - 2 Score 0   Altered sleeping 1   Tired, decreased energy 1   Change in appetite 0   Feeling bad or failure about yourself  0   Trouble concentrating 0   Moving slowly or fidgety/restless 0   Suicidal thoughts 0   PHQ-9 Score 2   Difficult doing work/chores Not difficult at all     Interpretation of Total Score  Total Score Depression Severity:  1-4 = Minimal depression, 5-9 = Mild depression, 10-14 = Moderate depression, 15-19 = Moderately severe depression, 20-27 = Severe depression   Psychosocial Evaluation and Intervention: Psychosocial Evaluation - 06/27/19 1030      Discharge Psychosocial Assessment & Intervention   Comments  Brandon Kennedy will be graduating at his next visit.  His wife is not comfortable with him coming out to the hospital as the numbers are rising.  He has enjoyed rehab and learned his limits.  He feels better when he exercises. He plans to continue to exericse by walking at home and maybe getting an elliptical.       Psychosocial Re-Evaluation: Psychosocial Re-Evaluation    Row Name 03/13/19 0745 04/03/19 0735 04/29/19 0812 05/27/19 0745       Psychosocial Re-Evaluation   Current issues with  Current Sleep Concerns  Current Sleep Concerns  Current Sleep Concerns  Current Sleep Concerns    Comments  Brandon Kennedy is doing well overall.  He is fatigued in afternoon after working 12 hour shifts.  He usually will sleep in 4 hour bouts and then wake and go back to sleep.  He used to work third shift and it still sticks with him.  Overall, he is doing well.  Brandon Kennedy is doing well overall. He has no major stressors.  He  is still waking up in the early hours and tosses and turns for a bit.  He will go out to work in garden and then nap in afternoon to get enough sleep.  He is still having a hard time adjusting to his 12 hour days again at work.  Mohd. is trying to go to bed early since he is know on day shift. He has been on nights for 22 years. He gets tired in the afternoon and takes a nap. Informed patient that it will take a little time to get back into a regular sleep cycle after being on night shift for so long. Patient verbalizes understanding.  Brandon Kennedy is doing well mentally.  He still is only getting a solid 4 hours and then wakes.  He has stopped his afternoon naps.  Mondays are his toughest due to his work schedule.  Overall his is in a good place mentally.  He is doing well at work.  He walks a lot at work and it hurts his knees and feet.  He wears three pairs of boots to rotate through.    Expected Outcomes  Short: Continue to try to sleep better and get his 6-7 hours.  Long: Continue to make time for self and exercise.  Short: Continue to try to sleep better and get his 6-7 hours.  Long: Continue to make time for self and exercise.  Short: continue to go to bed early. Long: get on a regular sleep cycle.  Short: Continue to work on sleep.  Long: Continue to sleep better.    Interventions  Encouraged to attend Cardiac Rehabilitation for the exercise  Encouraged to attend Cardiac Rehabilitation   for the exercise  Encouraged to attend Cardiac Rehabilitation for the exercise  Encouraged to attend Cardiac Rehabilitation for the exercise    Continue Psychosocial Services   Follow up required by staff  Follow up required by staff  Follow up required by staff  Follow up required by staff       Psychosocial Discharge (Final Psychosocial Re-Evaluation): Psychosocial Re-Evaluation - 05/27/19 0745      Psychosocial Re-Evaluation   Current issues with  Current Sleep Concerns    Comments  Brandon Kennedy is doing well mentally.  He still  is only getting a solid 4 hours and then wakes.  He has stopped his afternoon naps.  Mondays are his toughest due to his work schedule.  Overall his is in a good place mentally.  He is doing well at work.  He walks a lot at work and it hurts his knees and feet.  He wears three pairs of boots to rotate through.    Expected Outcomes  Short: Continue to work on sleep.  Long: Continue to sleep better.    Interventions  Encouraged to attend Cardiac Rehabilitation for the exercise    Continue Psychosocial Services   Follow up required by staff       Vocational Rehabilitation: Provide vocational rehab assistance to qualifying candidates.   Vocational Rehab Evaluation & Intervention: Vocational Rehab - 02/26/19 1039      Initial Vocational Rehab Evaluation & Intervention   Assessment shows need for Vocational Rehabilitation  No       Education: Education Goals: Education classes will be provided on a variety of topics geared toward better understanding of heart health and risk factor modification. Participant will state understanding/return demonstration of topics presented as noted by education test scores.  Learning Barriers/Preferences: Learning Barriers/Preferences - 02/26/19 1039      Learning Barriers/Preferences   Learning Barriers  None    Learning Preferences  None       Education Topics:  AED/CPR: - Group verbal and written instruction with the use of models to demonstrate the basic use of the AED with the basic ABC's of resuscitation.   General Nutrition Guidelines/Fats and Fiber: -Group instruction provided by verbal, written material, models and posters to present the general guidelines for heart healthy nutrition. Gives an explanation and review of dietary fats and fiber.   Cardiac Rehab from 05/22/2019 in [facility] Cardiac and Pulmonary Rehab  Date  05/08/19  Educator  [facility]  Instruction Review Code  1- Verbalizes Understanding      Controlling Sodium/Reading Food  Labels: -Group verbal and written material supporting the discussion of sodium use in heart healthy nutrition. Review and explanation with models, verbal and written materials for utilization of the food label.   Exercise Physiology & General Exercise Guidelines: - Group verbal and written instruction with models to review the exercise physiology of the cardiovascular system and associated critical values. Provides general exercise guidelines with specific guidelines to those with heart or lung disease.    Aerobic Exercise & Resistance Training: - Gives group verbal and written instruction on the various components of exercise. Focuses on aerobic and resistive training programs and the benefits of this training and how to safely progress through these programs..   Cardiac Rehab from 05/22/2019 in [facility] Cardiac and Pulmonary Rehab  Date  04/24/19  Educator  JH  Instruction Review Code  1- Verbalizes Understanding      Flexibility, Balance, Mind/Body Relaxation: Provides group verbal/written instruction on the benefits of flexibility and   balance training, including mind/body exercise modes such as yoga, pilates and tai chi.  Demonstration and skill practice provided.   Cardiac Rehab from 05/22/2019 in [facility] Cardiac and Pulmonary Rehab  Date  05/22/19  Educator  AS  Instruction Review Code  1- Verbalizes Understanding      Stress and Anxiety: - Provides group verbal and written instruction about the health risks of elevated stress and causes of high stress.  Discuss the correlation between heart/lung disease and anxiety and treatment options. Review healthy ways to manage with stress and anxiety.   Depression: - Provides group verbal and written instruction on the correlation between heart/lung disease and depressed mood, treatment options, and the stigmas associated with seeking treatment.   Anatomy & Physiology of the Heart: - Group verbal and written instruction and models provide  basic cardiac anatomy and physiology, with the coronary electrical and arterial systems. Review of Valvular disease and Heart Failure   Cardiac Procedures: - Group verbal and written instruction to review commonly prescribed medications for heart disease. Reviews the medication, class of the drug, and side effects. Includes the steps to properly store meds and maintain the prescription regimen. (beta blockers and nitrates)   Cardiac Rehab from 05/22/2019 in [facility] Cardiac and Pulmonary Rehab  Date  04/24/19  Educator  [facility]  Instruction Review Code  1- Verbalizes Understanding      Cardiac Medications I: - Group verbal and written instruction to review commonly prescribed medications for heart disease. Reviews the medication, class of the drug, and side effects. Includes the steps to properly store meds and maintain the prescription regimen.   Cardiac Medications II: -Group verbal and written instruction to review commonly prescribed medications for heart disease. Reviews the medication, class of the drug, and side effects. (all other drug classes)   Cardiac Rehab from 04/10/2019 in [facility] Cardiac and Pulmonary Rehab  Date  04/10/19  Educator  [facility]  Instruction Review Code  1- Verbalizes Understanding       Go Sex-Intimacy & Heart Disease, Get SMART - Goal Setting: - Group verbal and written instruction through game format to discuss heart disease and the return to sexual intimacy. Provides group verbal and written material to discuss and apply goal setting through the application of the S.M.A.R.T. Method.   Cardiac Rehab from 05/22/2019 in [facility] Cardiac and Pulmonary Rehab  Date  04/24/19  Educator  [facility]  Instruction Review Code  1- Verbalizes Understanding      Other Matters of the Heart: - Provides group verbal, written materials and models to describe Stable Angina and Peripheral Artery. Includes description of the disease process and treatment options available to the cardiac  patient.   Exercise & Equipment Safety: - Individual verbal instruction and demonstration of equipment use and safety with use of the equipment.   Cardiac Rehab from 02/27/2019 in [facility] Cardiac and Pulmonary Rehab  Date  02/27/19  Educator  [facility]  Instruction Review Code  1- Verbalizes Understanding      Infection Prevention: - Provides verbal and written material to individual with discussion of infection control including proper hand washing and proper equipment cleaning during exercise session.   Cardiac Rehab from 02/27/2019 in [facility] Cardiac and Pulmonary Rehab  Date  02/27/19  Educator  [facility]  Instruction Review Code  1- Verbalizes Understanding      Falls Prevention: - Provides verbal and written material to individual with discussion of falls prevention and safety.   Cardiac Rehab from 02/27/2019 in [facility] Cardiac and Pulmonary  Rehab  Date  02/27/19  Educator  JH  Instruction Review Code  1- Verbalizes Understanding      Diabetes: - Individual verbal and written instruction to review signs/symptoms of diabetes, desired ranges of glucose level fasting, after meals and with exercise. Acknowledge that pre and post exercise glucose checks will be done for 3 sessions at entry of program.   Know Your Numbers and Risk Factors: -Group verbal and written instruction about important numbers in your health.  Discussion of what are risk factors and how they play a role in the disease process.  Review of Cholesterol, Blood Pressure, Diabetes, and BMI and the role they play in your overall health.   Cardiac Rehab from 04/10/2019 in [facility] Cardiac and Pulmonary Rehab  Date  04/10/19  Educator  [facility]  Instruction Review Code  1- Verbalizes Understanding      Sleep Hygiene: -Provides group verbal and written instruction about how sleep can affect your health.  Define sleep hygiene, discuss sleep cycles and impact of sleep habits. Review good sleep hygiene tips.    Other: -Provides group and  verbal instruction on various topics (see comments)   Knowledge Questionnaire Score:   Core Components/Risk Factors/Patient Goals at Admission: Personal Goals and Risk Factors at Admission - 02/27/19 1112      Core Components/Risk Factors/Patient Goals on Admission    Weight Management  Yes;Weight Loss    Intervention  Weight Management: Develop a combined nutrition and exercise program designed to reach desired caloric intake, while maintaining appropriate intake of nutrient and fiber, sodium and fats, and appropriate energy expenditure required for the weight goal.;Weight Management: Provide education and appropriate resources to help participant work on and attain dietary goals.    Admit Weight  202 lb 1.6 oz (91.7 kg)    Goal Weight: Short Term  195 lb (88.5 kg)    Goal Weight: Long Term  190 lb (86.2 kg)    Expected Outcomes  Short Term: Continue to assess and modify interventions until short term weight is achieved;Long Term: Adherence to nutrition and physical activity/exercise program aimed toward attainment of established weight goal;Weight Loss: Understanding of general recommendations for a balanced deficit meal plan, which promotes 1-2 lb weight loss per week and includes a negative energy balance of 500-1000 kcal/d;Understanding recommendations for meals to include 15-35% energy as protein, 25-35% energy from fat, 35-60% energy from carbohydrates, less than 200mg of dietary cholesterol, 20-35 gm of total fiber daily;Understanding of distribution of calorie intake throughout the day with the consumption of 4-5 meals/snacks    Heart Failure  Yes    Intervention  Provide a combined exercise and nutrition program that is supplemented with education, support and counseling about heart failure. Directed toward relieving symptoms such as shortness of breath, decreased exercise tolerance, and extremity edema.    Expected Outcomes  Improve functional capacity of life;Short term: Attendance in  program 2-3 days a week with increased exercise capacity. Reported lower sodium intake. Reported increased fruit and vegetable intake. Reports medication compliance.;Short term: Daily weights obtained and reported for increase. Utilizing diuretic protocols set by physician.;Long term: Adoption of self-care skills and reduction of barriers for early signs and symptoms recognition and intervention leading to self-care maintenance.    Lipids  Yes    Intervention  Provide education and support for participant on nutrition & aerobic/resistive exercise along with prescribed medications to achieve LDL <70mg, HDL >40mg.    Expected Outcomes  Short Term: Participant states understanding of desired cholesterol values   and is compliant with medications prescribed. Participant is following exercise prescription and nutrition guidelines.;Long Term: Cholesterol controlled with medications as prescribed, with individualized exercise RX and with personalized nutrition plan. Value goals: LDL < 70mg, HDL > 40 mg.       Core Components/Risk Factors/Patient Goals Review:  Goals and Risk Factor Review    Row Name 03/13/19 0748 04/03/19 0744 04/29/19 0815 05/27/19 0746       Core Components/Risk Factors/Patient Goals Review   Personal Goals Review  Weight Management/Obesity;Hypertension;Heart Failure;Lipids  Weight Management/Obesity;Hypertension;Heart Failure;Lipids  Weight Management/Obesity;Improve shortness of breath with ADL's  Weight Management/Obesity;Improve shortness of breath with ADL's;Hypertension    Review  Brandon Kennedy is doing well with his weight. He checks it in class as he doesn't check it at home as he doesn't have a scale.  He will let us know about the scale.  Reviewed heart failure action plan with him today.  He is watching his sodium levels.  His pressures have been good in class as he does not have a cuff.  He will look into it.  We can into foundation for providing these to him.  He is doing well with his  medicaitons.  Brandon Kennedy is staying steady with his weight around 202 lbs.  He still has not gotten a scale or cuff so he only checks them here.  His pressures have been good in class.  He has not had any heart failure symptoms.  We will look into foundation for help with scale and cuff to heart failure management.  Brandon Kennedy wants to lose some more weight and be around 190 pounds. He has been eating healthy but eating to much. Informed him to drink some water before a meal and wait after heats his plate to let his food settle. He is going to try to lose 5 pounds in the next two weeks.  Brandon Kennedy is doing well.  His weight is staying around 200-205 lbs.  He continues to eat better and drinks lots of water.  He continues to try to lose weight. His breathing is getting better and now its just the hard work that gets him.  His pressures have been good in class (he does not check at home).  He had a follow up last week and the doctor was pleased with his progress.    Expected Outcomes  Short: Look into getting scale and cuff for home use.  Long: Continue to monitor risk factors.  Short: talk to foundation.  Long: Continue to montior heart failure.  Short: lose 5 pounds in two weeks. Long: maintain weight loss independently.  Short: Continue to work on weight loss.  Long: Continue to monitor risk factors.       Core Components/Risk Factors/Patient Goals at Discharge (Final Review):  Goals and Risk Factor Review - 05/27/19 0746      Core Components/Risk Factors/Patient Goals Review   Personal Goals Review  Weight Management/Obesity;Improve shortness of breath with ADL's;Hypertension    Review  Brandon Kennedy is doing well.  His weight is staying around 200-205 lbs.  He continues to eat better and drinks lots of water.  He continues to try to lose weight. His breathing is getting better and now its just the hard work that gets him.  His pressures have been good in class (he does not check at home).  He had a follow up last week and the  doctor was pleased with his progress.    Expected Outcomes  Short: Continue to work on weight   loss.  Long: Continue to monitor risk factors.       ITP Comments: ITP Comments    Row Name 02/26/19 1043 02/27/19 1052 03/26/19 1311 04/23/19 0634 05/21/19 1012   ITP Comments  Virtual initial orientation completed. Diagnosis can be found in CE 8/26. EP/RD orientation scheduled for 9/10 at 9:30  Completed 6MWT, gym orientation, and RD evaluation. Initial ITP created and sent for review to Dr. Emily Filbert, Medical Director.  30 day review completed. ITP sent to Dr. Emily Filbert, Medical Director of Cardiac and Pulmonary Rehab. Continue with ITP unless changes are made by physician.  Department closed starting 10/2 until further notice by infection prevention and Health at Work teams for COVID-19.  30 day review completed. Continue with ITP sent to Dr. Emily Filbert, Medical Director of Cardiac and Pulmonary Rehab for review , changes as needed and signature.  30 day review competed . ITP sent to Dr Emily Filbert for review, changes as needed and ITP approval signature.   [place] Name 06/18/19 0850 06/27/19 1022 07/03/19 0854 07/16/19 1125 07/16/19 1126   ITP Comments  30 day review competed . ITP sent to Dr Emily Filbert for review, changes as needed and ITP approval signature  Due to the increase in COVID-19 numbers, Brandon Kennedy would like to graduate at his next visit.  Virtual call completed today. Calls are done every week that patient is not attending an onsite exercise session during the COVID 19 PHE Crisis.   Today's call included review of :their home exercise    Brandon Kennedy is using his new [employer]  Daily ,except workdays,with 15 min of weights and stretching while he is not in class.   No voiced concerns today.  Brandon Kennedy called out sick on last week when he supposed to graduate.  Left message that we can still finish up this next week instead.  Scheduled for follow up call tomorrow.  30 day review completed. ITP sent to  Dr. Emily Filbert, Medical Director of Cardiac and Pulmonary Rehab. Continue with ITP unless changes are made by physician.  Department operating under reduced schedule until further notice by request from hospital leadership.      Comments: 30 day review

## 2019-07-16 NOTE — Telephone Encounter (Signed)
Brandon Kennedy called out sick on last week when he supposed to graduate.  Left message that we can still finish up this next week instead.  Scheduled for follow up call tomorrow.

## 2019-07-17 ENCOUNTER — Other Ambulatory Visit: Payer: Self-pay

## 2019-07-17 DIAGNOSIS — Z952 Presence of prosthetic heart valve: Secondary | ICD-10-CM

## 2019-07-17 NOTE — Progress Notes (Signed)
Virtual call completed today. Calls are done every week that patient is not attending an onsite exercise session during the COVID 19 PHE Crisis. Today's call included review of : their home exercise. Patient is doing his home exercise and plans to graduate [employer] next Thursday.

## 2019-08-07 ENCOUNTER — Encounter: Payer: PRIVATE HEALTH INSURANCE | Attending: Internal Medicine

## 2019-08-07 DIAGNOSIS — Z952 Presence of prosthetic heart valve: Secondary | ICD-10-CM | POA: Insufficient documentation

## 2019-08-13 ENCOUNTER — Encounter: Payer: Self-pay | Admitting: *Deleted

## 2019-08-13 DIAGNOSIS — Z952 Presence of prosthetic heart valve: Secondary | ICD-10-CM

## 2019-08-13 NOTE — Progress Notes (Signed)
Cardiac Individual Treatment Plan  Patient Details  Name: Brandon Kennedy MRN: [id number] Date of Birth: 05/29/1961 Referring Provider:     Cardiac Rehab from 02/27/2019 in [facility]  Referring Provider  Vavalle, John MD      Initial Encounter Date:    Cardiac Rehab from 02/27/2019 in [facility]  Date  02/27/19      Visit Diagnosis: S/P TAVR (transcatheter aortic valve replacement)  Patient's Home Medications on Admission:  Current Outpatient Medications:  .  acetaminophen (TYLENOL) 500 MG tablet, Take by mouth., Disp: , Rfl:  .  aspirin EC 81 MG tablet, Take by mouth., Disp: , Rfl:  .  losartan (COZAAR) 25 MG tablet, Take by mouth., Disp: , Rfl:  .  metoprolol succinate (TOPROL-XL) 25 MG 24 hr tablet, Take by mouth., Disp: , Rfl:   Past Medical History: No past medical history on file.  Tobacco Use: Social History   Tobacco Use  Smoking Status Never Smoker  Smokeless Tobacco Never Used    Labs: Recent Review Flowsheet Data    There is no flowsheet data to display.       Exercise Target Goals: Exercise Program Goal: Individual exercise prescription set using results from initial 6 min walk test and THRR while considering  patient's activity barriers and safety.   Exercise Prescription Goal: Initial exercise prescription builds to 30-45 minutes a day of aerobic activity, 2-3 days per week.  Home exercise guidelines will be given to patient during program as part of exercise prescription that the participant will acknowledge.  Activity Barriers & Risk Stratification: Activity Barriers & Cardiac Risk Stratification - 02/27/19 1054      Activity Barriers & Cardiac Risk Stratification   Activity Barriers  Arthritis;Joint Problems;Decreased Ventricular Function;Muscular Weakness   bilateral knee pain   Cardiac Risk Stratification  Low       6 Minute Walk: 6 Minute Walk    Row Name 02/27/19 1052 06/26/19 0756        6 Minute Walk   Phase  Initial  Discharge    Distance  1806 feet  1900 feet    Distance % Change  --  5.2 %    Distance Feet Change  --  94 ft    Walk Time  6 minutes  6 minutes    # of Rest Breaks  0  0    MPH  3.42  3.5    METS  5.13  4.71    RPE  14  15    VO2 Peak  17.97  16.48    Symptoms  No  No    Resting HR  85 bpm  57 bpm    Resting BP  142/86  126/72    Resting Oxygen Saturation   98 %  --    Exercise Oxygen Saturation  during 6 min walk  99 %  --    Max Ex. HR  128 bpm  115 bpm    Max Ex. BP  184/74 recheck BP prior to resistance training 164/72  138/70    2 Minute Post BP  142/74  --       Oxygen Initial Assessment:   Oxygen Re-Evaluation:   Oxygen Discharge (Final Oxygen Re-Evaluation):   Initial Exercise Prescription: Initial Exercise Prescription - 02/27/19 1000      Date of Initial Exercise RX and Referring Provider   Date  02/27/19    Referring Provider  Vavalle, John MD        Treadmill   MPH  3.3    Grade  1    Minutes  15    METs  3.71      REL-XR   Level  3    Speed  50    Minutes  15    METs  3.5      T5 Nustep   Level  4    SPM  80    Minutes  15    METs  3.5      Prescription Details   Frequency (times per week)  2    Duration  Progress to 30 minutes of continuous aerobic without signs/symptoms of physical distress      Intensity   THRR 40-80% of Max Heartrate  115-145    Ratings of Perceived Exertion  11-13    Perceived Dyspnea  0-4      Progression   Progression  Continue to progress workloads to maintain intensity without signs/symptoms of physical distress.      Resistance Training   Training Prescription  Yes    Weight  4 lbs    Reps  10-15       Perform Capillary Blood Glucose checks as needed.  Exercise Prescription Changes: Exercise Prescription Changes    Row Name 02/27/19 1100 03/07/19 0700 03/11/19 1400 03/19/19 1400 04/02/19 1000     Response to Exercise   Blood Pressure (Admit)  142/86  128/64  --   94/60  126/66   Blood Pressure (Exercise)  184/74 rck 164/72  146/62  --  140/78  144/64   Blood Pressure (Exit)  142/74  124/70  --  94/56  118/64   Heart Rate (Admit)  85 bpm  68 bpm  --  68 bpm  63 bpm   Heart Rate (Exercise)  128 bpm  127 bpm  --  107 bpm  102 bpm   Heart Rate (Exit)  92 bpm  --  --  71 bpm  83 bpm   Oxygen Saturation (Admit)  98 %  --  --  --  --   Oxygen Saturation (Exercise)  99 %  --  --  --  --   Rating of Perceived Exertion (Exercise)  14  13  --  13  13   Symptoms  none  none  --  none  none   Comments  walk test results  --  --  --  --   Duration  --  Continue with 30 min of aerobic exercise without signs/symptoms of physical distress.  --  Continue with 30 min of aerobic exercise without signs/symptoms of physical distress.  Continue with 30 min of aerobic exercise without signs/symptoms of physical distress.   Intensity  --  THRR unchanged  --  THRR unchanged  THRR unchanged     Progression   Progression  --  --  --  Continue to progress workloads to maintain intensity without signs/symptoms of physical distress.  Continue to progress workloads to maintain intensity without signs/symptoms of physical distress.   Average METs  --  --  --  3.69  3.33     Resistance Training   Training Prescription  --  Yes  --  Yes  Yes   Weight  --  4 lb  --  5 lbs  5 lbs   Reps  --  10-15  --  10-15  10-15     Interval Training   Interval Training  --  No  --  --    No     Treadmill   MPH  --  3  --  3.3  3.3   Grade  --  1  --  1  1   Minutes  --  15  --  15  15   METs  --  3.71  --  3.98  3.98     REL-XR   Level  --  3  --  8  8   Speed  --  50  --  --  --   Minutes  --  15  --  15  15   METs  --  2.8  --  4.8  3.1     T5 Nustep   Level  --  4  --  5  5   SPM  --  80  --  --  --   Minutes  --  15  --  15  15   METs  --  2.7  --  2.3  2.9     Home Exercise Plan   Plans to continue exercise at  --  --  Home (comment)  Home (comment)  Home (comment)    Frequency  --  --  Add 3 additional days to program exercise sessions.  Add 3 additional days to program exercise sessions.  Add 3 additional days to program exercise sessions.   Initial Home Exercises Provided  --  --  03/11/19  03/11/19  03/11/19   Row Name 04/15/19 1400 04/30/19 1200 05/13/19 0900 05/28/19 1500 06/10/19 1400     Response to Exercise   Blood Pressure (Admit)  110/60  122/54  126/58  134/70  132/74   Blood Pressure (Exercise)  142/72  132/62  150/64  142/60  144/74   Blood Pressure (Exit)  104/64  130/64  128/74  136/70  122/64   Heart Rate (Admit)  57 bpm  65 bpm  66 bpm  120 bpm  63 bpm   Heart Rate (Exercise)  123 bpm  117 bpm  123 bpm  128 bpm  130 bpm   Heart Rate (Exit)  72 bpm  77 bpm  82 bpm  93 bpm  94 bpm   Rating of Perceived Exertion (Exercise)  13  15  15  15  15   Symptoms  none  none  none  none  none   Duration  Continue with 30 min of aerobic exercise without signs/symptoms of physical distress.  Continue with 30 min of aerobic exercise without signs/symptoms of physical distress.  Continue with 30 min of aerobic exercise without signs/symptoms of physical distress.  Continue with 30 min of aerobic exercise without signs/symptoms of physical distress.  Continue with 30 min of aerobic exercise without signs/symptoms of physical distress.   Intensity  THRR unchanged  THRR unchanged  THRR unchanged  THRR unchanged  THRR unchanged     Progression   Progression  Continue to progress workloads to maintain intensity without signs/symptoms of physical distress.  Continue to progress workloads to maintain intensity without signs/symptoms of physical distress.  Continue to progress workloads to maintain intensity without signs/symptoms of physical distress.  Continue to progress workloads to maintain intensity without signs/symptoms of physical distress.  Continue to progress workloads to maintain intensity without signs/symptoms of physical distress.   Average METs  3.6   5.87  4.7  6.9  6     Resistance Training   Training Prescription  Yes  Yes  Yes  Yes    Yes   Weight  5 lb  7 lb  7 lb  7 lb  7 lb   Reps  10-15  10-15  10-15  10-15  10-15     Interval Training   Interval Training  No  Yes  Yes  Yes  --   Equipment  --  Treadmill;REL-XR;T5 Nustep  Treadmill;Recumbant Elliptical;T5 Nustep  Treadmill;Recumbant Elliptical;T5 Nustep  --   Comments  --  2 min off 30 sec on (1:1 on TM)  2 mon off 30 sec on  2 min off 30 sec on  --     Treadmill   MPH  3.3  3.3  --  3.3  3.3   Grade  1  7  --  7.5  9   Minutes  15  15  --  15  15   METs  3.98  6.4  --  6.9  6.9     Elliptical   Level  --  --  --  2  3   Speed  --  --  --  4  4   Minutes  --  --  --  15  15     REL-XR   Level  8  10  10  --  --   Minutes  15  15  15  --  --   METs  4.8  7.4  6.7  --  --     T5 Nustep   Level  6  6  7  --  --   Minutes  15  15  15  --  --   METs  2.4  3.5  2.7  --  --     Home Exercise Plan   Plans to continue exercise at  Home (comment)  Home (comment)  Home (comment)  Home (comment)  Home (comment)   Frequency  Add 3 additional days to program exercise sessions.  Add 3 additional days to program exercise sessions.  Add 3 additional days to program exercise sessions.  Add 3 additional days to program exercise sessions.  Add 3 additional days to program exercise sessions.   Initial Home Exercises Provided  --  03/11/19  03/11/19  03/11/19  03/11/19   Row Name 06/27/19 1000 07/09/19 1400           Response to Exercise   Blood Pressure (Admit)  128/64  126/72      Blood Pressure (Exercise)  164/72  138/70      Blood Pressure (Exit)  126/70  136/74      Heart Rate (Admit)  76 bpm  61 bpm      Heart Rate (Exercise)  130 bpm  137 bpm      Heart Rate (Exit)  96 bpm  89 bpm      Rating of Perceived Exertion (Exercise)  15  15      Symptoms  none  none      Duration  Continue with 30 min of aerobic exercise without signs/symptoms of physical distress.  Continue with  30 min of aerobic exercise without signs/symptoms of physical distress.      Intensity  THRR unchanged  THRR unchanged        Progression   Progression  Continue to progress workloads to maintain intensity without signs/symptoms of physical distress.  Continue to progress workloads to maintain intensity without signs/symptoms of physical distress.      Average METs  8.1  --          Resistance Training   Training Prescription  Yes  Yes      Weight  7 lb  7 lb      Reps  10-15  10-15        Interval Training   Interval Training  Yes  Yes      Equipment  Treadmill;Elliptical  Treadmill;Elliptical      Comments  1 min on 1 min off  --        Treadmill   MPH  3.3  3.3      Grade  10  3 up to 10       Minutes  15  15      METs  8.1  4.89        Elliptical   Level  10  3      Speed  4.5  4      Minutes  15  15        Home Exercise Plan   Plans to continue exercise at  Home (comment)  --      Frequency  Add 3 additional days to program exercise sessions.  --      Initial Home Exercises Provided  03/11/19  --         Exercise Comments: Exercise Comments    Row Name 03/04/19 0800 07/03/19 0854         Exercise Comments  First full day of exercise!  Patient was oriented to gym and equipment including functions, settings, policies, and procedures.  Patient's individual exercise prescription and treatment plan were reviewed.  All starting workloads were established based on the results of the 6 minute walk test done at initial orientation visit.  The plan for exercise progression was also introduced and progression will be customized based on patient's performance and goals.  Virtual call completed today. Calls are done every week that patient is not attending an onsite exercise session during the COVID 19 PHE Crisis.   Today's call included review of :their home exercise    Eward is using his new [address]  Daily ,except workdays,with 15 min of weights and stretching while he is  not in class.   No voiced concerns today.         Exercise Goals and Review: Exercise Goals    Row Name 02/27/19 1109             Exercise Goals   Increase Physical Activity  Yes       Intervention  Provide advice, education, support and counseling about physical activity/exercise needs.;Develop an individualized exercise prescription for aerobic and resistive training based on initial evaluation findings, risk stratification, comorbidities and participant's personal goals.       Expected Outcomes  Short Term: Attend rehab on a regular basis to increase amount of physical activity.;Long Term: Add in home exercise to make exercise part of routine and to increase amount of physical activity.;Long Term: Exercising regularly at least 3-5 days a week.       Increase Strength and Stamina  Yes       Intervention  Provide advice, education, support and counseling about physical activity/exercise needs.;Develop an individualized exercise prescription for aerobic and resistive training based on initial evaluation findings, risk stratification, comorbidities and participant's personal goals.       Expected Outcomes  Short Term: Increase workloads from initial exercise prescription for resistance, speed, and METs.;Short Term: Perform resistance training exercises routinely during rehab and add in resistance training at home;Long   Term: Improve cardiorespiratory fitness, muscular endurance and strength as measured by increased METs and functional capacity (6MWT)       Able to understand and use rate of perceived exertion (RPE) scale  Yes       Intervention  Provide education and explanation on how to use RPE scale       Expected Outcomes  Short Term: Able to use RPE daily in rehab to express subjective intensity level;Long Term:  Able to use RPE to guide intensity level when exercising independently       Knowledge and understanding of Target Heart Rate Range (THRR)  Yes       Intervention  Provide  education and explanation of THRR including how the numbers were predicted and where they are located for reference       Expected Outcomes  Short Term: Able to state/look up THRR;Short Term: Able to use daily as guideline for intensity in rehab;Long Term: Able to use THRR to govern intensity when exercising independently       Able to check pulse independently  Yes       Intervention  Provide education and demonstration on how to check pulse in carotid and radial arteries.;Review the importance of being able to check your own pulse for safety during independent exercise       Expected Outcomes  Short Term: Able to explain why pulse checking is important during independent exercise;Long Term: Able to check pulse independently and accurately       Understanding of Exercise Prescription  Yes       Intervention  Provide education, explanation, and written materials on patient's individual exercise prescription       Expected Outcomes  Long Term: Able to explain home exercise prescription to exercise independently;Short Term: Able to explain program exercise prescription          Exercise Goals Re-Evaluation : Exercise Goals Re-Evaluation    Row Name 03/04/19 0800 03/07/19 0745 03/13/19 0740 03/18/19 1520 04/01/19 1545     Exercise Goal Re-Evaluation   Exercise Goals Review  Increase Physical Activity;Increase Strength and Stamina;Able to understand and use rate of perceived exertion (RPE) scale;Able to understand and use Dyspnea scale;Knowledge and understanding of Target Heart Rate Range (THRR);Able to check pulse independently;Understanding of Exercise Prescription  Increase Physical Activity;Increase Strength and Stamina;Able to understand and use rate of perceived exertion (RPE) scale;Able to understand and use Dyspnea scale;Knowledge and understanding of Target Heart Rate Range (THRR);Able to check pulse independently;Understanding of Exercise Prescription  Increase Physical Activity;Increase  Strength and Stamina;Understanding of Exercise Prescription  Increase Physical Activity;Increase Strength and Stamina;Understanding of Exercise Prescription  Increase Physical Activity;Increase Strength and Stamina;Understanding of Exercise Prescription   Comments  Reviewed RPE scale, THR and program prescription with pt today.  Pt voiced understanding and was given a copy of goals to take home.  Matti is tolerating exercise well.  Staff will continue to monitor progress.  Bon is doing well in rehab. He is off to a good start and already walking at home.  He is starting to recover his strength and stamina.  He is not doing anything too strenuous yet.  He is fatigued after work for standing and walking for 12 hours.  He naps in the afternoons.  Yamir continues to do well in rehab.  He is doing well with his workloads and should be ready to start to move them up!!  We will continue to monitor his progress.  Nolawi has been doing well in   rehab.  He has increased almost all of his workloads!  We will continue to monitor his progression.   Expected Outcomes  Short: Use RPE daily to regulate intensity. Long: Follow program prescription in THR.  Short - attend consistently Long - increase overall MET level  Short: Continue to walk on off days.  Long: Continue to increase stamina.  Short: Begin to increase workloads.  Long: Continue to improve stamina.  Short: Review home exercise guidelines.  Long: Continue to improve stamina.   [place] Name 04/03/19 1950 04/15/19 1406 04/29/19 0807 05/13/19 0930 05/27/19 0743     Exercise Goal Re-Evaluation   Exercise Goals Review  Increase Physical Activity;Increase Strength and Stamina;Understanding of Exercise Prescription  Increase Physical Activity;Increase Strength and Stamina;Able to understand and use rate of perceived exertion (RPE) scale;Knowledge and understanding of Target Heart Rate Range (THRR);Able to check pulse independently;Understanding of Exercise Prescription  Increase  Physical Activity;Increase Strength and Stamina;Knowledge and understanding of Target Heart Rate Range (THRR)  Increase Physical Activity;Increase Strength and Stamina;Able to understand and use rate of perceived exertion (RPE) scale;Able to understand and use Dyspnea scale;Knowledge and understanding of Target Heart Rate Range (THRR);Able to check pulse independently;Understanding of Exercise Prescription  Increase Physical Activity;Increase Strength and Stamina;Understanding of Exercise Prescription   Comments  Alger continues to do well in rehab. He is working hard in class and exercising at home. He also stays active by gardening.  He has noticed that he is now able to do more in his garden without getting as short of breath as during the summer.  Matthias is continuing to attend consistently and work in correct THR and RPE range.  He also exercsises at home.  Bijon works 3 days a week for 12 hours shifts. He walks at home and does weights twice a week. He does not exercise on the weekend but he is walking all day long. Sometimes when he is out gardening he over does it but feels like he can do alot more than he used to. He knows his target heart rate and knows how to check it manually.  Vuong does intervals during cardio sessions and has increased overall MET level.  Staff will monitor progress.  Anjel is doing well in rehab.  He is enjoying intervals.  He is walking at home and using weights.  He has noticed that Mondays are tough after the weekend, but gets back into the flow by Tuesday and Wednesday.  He feels that he has his strength back.   Expected Outcomes  Short: Continue to exericse at home on off day.  Long: Continue to work on stamina in garden.  Short - continue to be consistent and increase workloads Long - increase overall MET level  Short: continue to exercise. Long: graduate [place].  Short - continue interval training Long : complete HT  Short: Continut to add in more exercise at home.  Long:  Continue to improve stamina.   [place]ir Name 06/10/19 1432 06/27/19 1022 07/09/19 1453 07/23/19 1203       Exercise Goal Re-Evaluation   Exercise Goals Review  Increase Physical Activity;Increase Strength and Stamina;Able to understand and use rate of perceived exertion (RPE) scale;Knowledge and understanding of Target Heart Rate Range (THRR);Able to check pulse independently;Understanding of Exercise Prescription  Increase Physical Activity;Increase Strength and Stamina;Understanding of Exercise Prescription  Increase Physical Activity;Increase Strength and Stamina;Able to understand and use rate of perceived exertion (RPE) scale;Able to understand and use Dyspnea scale;Knowledge and understanding of Target Heart Rate  Range (THRR);Able to check pulse independently;Understanding of Exercise Prescription  Increase Physical Activity;Increase Strength and Stamina;Understanding of Exercise Prescription    Comments  Almer has increased intervals to 9% on TM.  He continues to be consistent in attendance and working on intervals  Wendal improved his post 6MWT by 67f! He is graduating early due to COVID-19 numbers.  He is has continued to increase his intervals on both the treadmill and elliptical.  He really enjoys the ellipitcal. He is planning to continue to exericse by walking at home and may look into purchasing an elliptical.  --  MBorahas been out since his last in person appt on 06/26/19.  He will be graduating tomorrow!  He will continue to walk at home.    Expected Outcomes  Short :  continue intervals Long - maintain fitness gains on his own  Short: Continue to exercise on his own. Long: Maintain his new healthier lifestyle.  --  Short: Continue to exercise on his own. Long: Maintain his new healthier lifestyle.       Discharge Exercise Prescription (Final Exercise Prescription Changes): Exercise Prescription Changes - 07/09/19 1400      Response to Exercise   Blood Pressure (Admit)  126/72    Blood  Pressure (Exercise)  138/70    Blood Pressure (Exit)  136/74    Heart Rate (Admit)  61 bpm    Heart Rate (Exercise)  137 bpm    Heart Rate (Exit)  89 bpm    Rating of Perceived Exertion (Exercise)  15    Symptoms  none    Duration  Continue with 30 min of aerobic exercise without signs/symptoms of physical distress.    Intensity  THRR unchanged      Progression   Progression  Continue to progress workloads to maintain intensity without signs/symptoms of physical distress.      Resistance Training   Training Prescription  Yes    Weight  7 lb    Reps  10-15      Interval Training   Interval Training  Yes    Equipment  Treadmill;Elliptical      Treadmill   MPH  3.3    Grade  3   up to 10    Minutes  15    METs  4.89      Elliptical   Level  3    Speed  4    Minutes  15       Nutrition:  Target Goals: Understanding of nutrition guidelines, daily intake of sodium '1500mg'$ , cholesterol '200mg'$ , calories 30% from fat and 7% or less from saturated fats, daily to have 5 or more servings of fruits and vegetables.  Biometrics: Pre Biometrics - 02/27/19 1112      Pre Biometrics   Height  5' 11.2" (1.808 m)    Weight  202 lb 1.6 oz (91.7 kg)    BMI (Calculated)  28.04    Single Leg Stand  30 seconds      Post Biometrics - 06/26/19 0757       Post  Biometrics   Height  5' 11.2" (1.808 m)    Weight  203 lb 14.4 oz (92.5 kg)    BMI (Calculated)  28.29    Single Leg Stand  30 seconds       Nutrition Therapy Plan and Nutrition Goals: Nutrition Therapy & Goals - 02/27/19 1048      Nutrition Therapy   Diet  low Na, HH diet  Protein (specify units)  95g    Fiber  30 grams    Whole Grain Foods  3 servings    Saturated Fats  12 max. grams    Fruits and Vegetables  5 servings/day    Sodium  1.5 grams      Personal Nutrition Goals   Nutrition Goal  ST: take note of the ranges of Na you have daily  LT: have and improved EF    Comments  Pt and wife used a lot of diet  culture buzz words "clean eating", "real food", "good food", "bad food", "chemicals". This RD discussed some myths and facts about food and food balance. Pt reports eating raisin bran with whole milk, either leftovers, whole grain sandwich with [place], or microwave meal (wife reports its a healthy one from the co-op), snacks are usually dry roasted/unsalted nuts or fruit, dinner is either chicken (red meat 2x/week) or plant protein like bean salad, whole grains, and vegetables. Pt and wife report eating mostly fresh foods and food from their garden. They choose healthy fats like olive oil and will use butter (but not as much). on weekends pt will have an egg with an english muffin. Pt wife reports cooking almost all the food from scratch and using salt which she has tried to cut back. Disucssed HH eating, low sodium eating, and fiber. Pt reports he likes what he is eating.      Intervention Plan   Intervention  Prescribe, educate and counsel regarding individualized specific dietary modifications aiming towards targeted core components such as weight, hypertension, lipid management, diabetes, heart failure and other comorbidities.;Nutrition handout(s) given to patient.    Expected Outcomes  Short Term Goal: Understand basic principles of dietary content, such as calories, fat, sodium, cholesterol and nutrients.;Short Term Goal: A plan has been developed with personal nutrition goals set during dietitian appointment.;Long Term Goal: Adherence to prescribed nutrition plan.       Nutrition Assessments: Nutrition Assessments - 02/27/19 1048      MEDFICTS Scores   Pre Score  32       Nutrition Goals Re-Evaluation: Nutrition Goals Re-Evaluation    Row Name 04/22/19 1131 05/13/19 0812 05/29/19 0814 06/26/19 0802       Goals   Nutrition Goal  ST: take note of the ranges of Na you have daily  LT: have and improved EF  ST: Be mindful of hunger scale LT: have improved EF  ST: Continue with current chnages  LT: have improved EF  ST: Continue with current changes LT: have improved EF    Comment  Continue with current changes  more aware of sodium, now uses lower salt in cooking. Talked about frozen meals over the weekend with wife. Thinks he eats too much on the weekend. bean and quinoa salads. using spices rather than salt. wife made broccoli salad.  Pt reports doing well with diet, really enjoys wifes broccoli salad that has a variety of fruits and vegetables and reports it is very filling. Pt reports listening to his hunger cues and monitoring his Na. Pt feels he didn't eat so HH this weekend due to neighbor prepparing homemade smoked BBQ- discussed balance and overall habits more important than individual day (pt reports that he does not have this normally).  Pt reports doing well with diet, really enjoys wifes broccoli salad that has a variety of fruits and vegetables and reports it is very filling. Pt reports that he feels he eats too much, discussed honoring hunger and  the hunger/fullness scale as well as practical hunger.  Pt reports no questions at this time.    Expected Outcome  ST: take note of the ranges of Na you have daily  LT: have and improved EF  ST: Be mindful of hunger scale LT: have improved EF  ST: Continue with current chnages LT: have improved EF  ST: Continue with current changes LT: have improved EF       Nutrition Goals Discharge (Final Nutrition Goals Re-Evaluation): Nutrition Goals Re-Evaluation - 06/26/19 0802      Goals   Nutrition Goal  ST: Continue with current changes LT: have improved EF    Comment  Pt reports doing well with diet, really enjoys wifes broccoli salad that has a variety of fruits and vegetables and reports it is very filling. Pt reports that he feels he eats too much, discussed honoring hunger and the hunger/fullness scale as well as practical hunger.  Pt reports no questions at this time.    Expected Outcome  ST: Continue with current changes LT: have  improved EF       Psychosocial: Target Goals: Acknowledge presence or absence of significant depression and/or stress, maximize coping skills, provide positive support system. Participant is able to verbalize types and ability to use techniques and skills needed for reducing stress and depression.   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 He is glad to be rid of his LifeVest, but wanting to make sure his heart is okay. He and his wife are making lifestyle changes and he wants to get more education regarding his health's future.      Family Dynamics   Good Support System?  Yes      Barriers   Psychosocial barriers to participate in program  There are no identifiable barriers or psychosocial needs.;The patient should benefit from training in stress management and relaxation.      Screening Interventions   Interventions  Encouraged to exercise;To provide support and resources with identified psychosocial needs;Provide feedback about the scores to participant    Expected Outcomes  Short Term goal: Utilizing psychosocial counselor, staff and physician to assist with identification of specific Stressors or current issues interfering with healing process. Setting desired goal for each stressor or current issue identified.;Long Term Goal: Stressors or current issues are controlled or eliminated.;Short Term goal: Identification and review with participant of any Quality of Life or Depression concerns found by scoring the questionnaire.;Long Term goal: The participant improves quality of Life and PHQ9 Scores as seen by post scores and/or verbalization of changes       Quality of Life Scores:  Quality of Life - 02/27/19 1115      Quality of Life   Select  Quality of Life      Quality of Life  Scores   Health/Function Pre  27.83 %    Socioeconomic Pre  28.07 %    Psych/Spiritual Pre  28.29 %    Family Pre  30 %    GLOBAL Pre  28.29 %      Scores of 19 and below usually indicate a poorer quality of life in these areas.  A difference of  2-3 points is a clinically meaningful difference.  A difference of 2-3 points  in the total score of the Quality of Life Index has been associated with significant improvement in overall quality of life, self-image, physical symptoms, and general health in studies assessing change in quality of life.  PHQ-9: Recent Review Flowsheet Data    Depression screen [facility] 2/9 02/27/2019   Decreased Interest 0   Down, Depressed, Hopeless 0   PHQ - 2 Score 0   Altered sleeping 1   Tired, decreased energy 1   Change in appetite 0   Feeling bad or failure about yourself  0   Trouble concentrating 0   Moving slowly or fidgety/restless 0   Suicidal thoughts 0   PHQ-9 Score 2   Difficult doing work/chores Not difficult at all     Interpretation of Total Score  Total Score Depression Severity:  1-4 = Minimal depression, 5-9 = Mild depression, 10-14 = Moderate depression, 15-19 = Moderately severe depression, 20-27 = Severe depression   Psychosocial Evaluation and Intervention: Psychosocial Evaluation - 06/27/19 1030      Discharge Psychosocial Assessment & Intervention   Comments  King will be graduating at his next visit.  His wife is not comfortable with him coming out to the hospital as the numbers are rising.  He has enjoyed rehab and learned his limits.  He feels better when he exercises. He plans to continue to exericse by walking at home and maybe getting an elliptical.       Psychosocial Re-Evaluation: Psychosocial Re-Evaluation    Row Name 03/13/19 0745 04/03/19 0735 04/29/19 0812 05/27/19 0745       Psychosocial Re-Evaluation   Current issues with  Current Sleep Concerns  Current Sleep Concerns  Current Sleep Concerns  Current Sleep  Concerns    Comments  Jhalen is doing well overall.  He is fatigued in afternoon after working 12 hour shifts.  He usually will sleep in 4 hour bouts and then wake and go back to sleep.  He used to work third shift and it still sticks with him.  Overall, he is doing well.  Kordel is doing well overall. He has no major stressors.  He is still waking up in the early hours and tosses and turns for a bit.  He will go out to work in garden and then nap in afternoon to get enough sleep.  He is still having a hard time adjusting to his 12 hour days again at work.  Ranald is trying to go to bed early since he is know on day shift. He has been on nights for 22 years. He gets tired in the afternoon and takes a nap. Informed patient that it will take a little time to get back into a regular sleep cycle after being on night shift for so long. Patient verbalizes understanding.  Carry is doing well mentally.  He still is only getting a solid 4 hours and then wakes.  He has stopped his afternoon naps.  Mondays are his toughest due to his work schedule.  Overall his is in a good place mentally.  He is doing well at work.  He walks a lot at work and it hurts his knees and feet.  He wears three pairs of boots to rotate through.    Expected Outcomes  Short: Continue to try to sleep better and get his 6-7 hours.  Long: Continue to make time for self and exercise.  Short: Continue to try to sleep better and get his 6-7 hours.  Long: Continue to make time  for self and exercise.  Short: continue to go to bed early. Long: get on a regular sleep cycle.  Short: Continue to work on sleep.  Long: Continue to sleep better.    Interventions  Encouraged to attend Cardiac Rehabilitation for the exercise  Encouraged to attend Cardiac Rehabilitation for the exercise  Encouraged to attend Cardiac Rehabilitation for the exercise  Encouraged to attend Cardiac Rehabilitation for the exercise    Continue Psychosocial Services   Follow up required by staff   Follow up required by staff  Follow up required by staff  Follow up required by staff       Psychosocial Discharge (Final Psychosocial Re-Evaluation): Psychosocial Re-Evaluation - 05/27/19 0745      Psychosocial Re-Evaluation   Current issues with  Current Sleep Concerns    Comments  Charlies is doing well mentally.  He still is only getting a solid 4 hours and then wakes.  He has stopped his afternoon naps.  Mondays are his toughest due to his work schedule.  Overall his is in a good place mentally.  He is doing well at work.  He walks a lot at work and it hurts his knees and feet.  He wears three pairs of boots to rotate through.    Expected Outcomes  Short: Continue to work on sleep.  Long: Continue to sleep better.    Interventions  Encouraged to attend Cardiac Rehabilitation for the exercise    Continue Psychosocial Services   Follow up required by staff       Vocational Rehabilitation: Provide vocational rehab assistance to qualifying candidates.   Vocational Rehab Evaluation & Intervention: Vocational Rehab - 02/26/19 1039      Initial Vocational Rehab Evaluation & Intervention   Assessment shows need for Vocational Rehabilitation  No       Education: Education Goals: Education classes will be provided on a variety of topics geared toward better understanding of heart health and risk factor modification. Participant will state understanding/return demonstration of topics presented as noted by education test scores.  Learning Barriers/Preferences: Learning Barriers/Preferences - 02/26/19 1039      Learning Barriers/Preferences   Learning Barriers  None    Learning Preferences  None       Education Topics:  AED/CPR: - Group verbal and written instruction with the use of models to demonstrate the basic use of the AED with the basic ABC's of resuscitation.   General Nutrition Guidelines/Fats and Fiber: -Group instruction provided by verbal, written material, models and  posters to present the general guidelines for heart healthy nutrition. Gives an explanation and review of dietary fats and fiber.   Cardiac Rehab from 05/22/2019 in [facility] Cardiac and Pulmonary Rehab  Date  05/08/19  Educator  [facility]  Instruction Review Code  1- Verbalizes Understanding      Controlling Sodium/Reading Food Labels: -Group verbal and written material supporting the discussion of sodium use in heart healthy nutrition. Review and explanation with models, verbal and written materials for utilization of the food label.   Exercise Physiology & General Exercise Guidelines: - Group verbal and written instruction with models to review the exercise physiology of the cardiovascular system and associated critical values. Provides general exercise guidelines with specific guidelines to those with heart or lung disease.    Aerobic Exercise & Resistance Training: - Gives group verbal and written instruction on the various components of exercise. Focuses on aerobic and resistive training programs and the benefits of this training and how to safely  progress through these programs..   Cardiac Rehab from 05/22/2019 in [facility] Cardiac and Pulmonary Rehab  Date  04/24/19  Educator  [facility]  Instruction Review Code  1- Verbalizes Understanding      Flexibility, Balance, Mind/Body Relaxation: Provides group verbal/written instruction on the benefits of flexibility and balance training, including mind/body exercise modes such as yoga, pilates and tai chi.  Demonstration and skill practice provided.   Cardiac Rehab from 05/22/2019 in [facility] Cardiac and Pulmonary Rehab  Date  05/22/19  Educator  [facility]  Instruction Review Code  1- Verbalizes Understanding      Stress and Anxiety: - Provides group verbal and written instruction about the health risks of elevated stress and causes of high stress.  Discuss the correlation between heart/lung disease and anxiety and treatment options. Review healthy ways to manage with  stress and anxiety.   Depression: - Provides group verbal and written instruction on the correlation between heart/lung disease and depressed mood, treatment options, and the stigmas associated with seeking treatment.   Anatomy & Physiology of the Heart: - Group verbal and written instruction and models provide basic cardiac anatomy and physiology, with the coronary electrical and arterial systems. Review of Valvular disease and Heart Failure   Cardiac Procedures: - Group verbal and written instruction to review commonly prescribed medications for heart disease. Reviews the medication, class of the drug, and side effects. Includes the steps to properly store meds and maintain the prescription regimen. (beta blockers and nitrates)   Cardiac Rehab from 05/22/2019 in [facility] Cardiac and Pulmonary Rehab  Date  04/24/19  Educator  [facility]  Instruction Review Code  1- Verbalizes Understanding      Cardiac Medications I: - Group verbal and written instruction to review commonly prescribed medications for heart disease. Reviews the medication, class of the drug, and side effects. Includes the steps to properly store meds and maintain the prescription regimen.   Cardiac Medications II: -Group verbal and written instruction to review commonly prescribed medications for heart disease. Reviews the medication, class of the drug, and side effects. (all other drug classes)   Cardiac Rehab from 04/10/2019 in [facility] Cardiac and Pulmonary Rehab  Date  04/10/19  Educator  [facility]  Instruction Review Code  1- Verbalizes Understanding       Go Sex-Intimacy & Heart Disease, Get SMART - Goal Setting: - Group verbal and written instruction through game format to discuss heart disease and the return to sexual intimacy. Provides group verbal and written material to discuss and apply goal setting through the application of the S.M.A.R.T. Method.   Cardiac Rehab from 05/22/2019 in [facility] Cardiac and Pulmonary Rehab  Date   04/24/19  Educator  [facility]  Instruction Review Code  1- Verbalizes Understanding      Other Matters of the Heart: - Provides group verbal, written materials and models to describe Stable Angina and Peripheral Artery. Includes description of the disease process and treatment options available to the cardiac patient.   Exercise & Equipment Safety: - Individual verbal instruction and demonstration of equipment use and safety with use of the equipment.   Cardiac Rehab from 02/27/2019 in [facility] Cardiac and Pulmonary Rehab  Date  02/27/19  Educator  [facility]  Instruction Review Code  1- Verbalizes Understanding      Infection Prevention: - Provides verbal and written material to individual with discussion of infection control including proper hand washing and proper equipment cleaning during exercise session.   Cardiac Rehab from 02/27/2019 in [facility] Cardiac and  Pulmonary Rehab  Date  02/27/19  Educator  [facility]  Instruction Review Code  1- Verbalizes Understanding      Falls Prevention: - Provides verbal and written material to individual with discussion of falls prevention and safety.   Cardiac Rehab from 02/27/2019 in [facility] Cardiac and Pulmonary Rehab  Date  02/27/19  Educator  [facility]  Instruction Review Code  1- Verbalizes Understanding      Diabetes: - Individual verbal and written instruction to review signs/symptoms of diabetes, desired ranges of glucose level fasting, after meals and with exercise. Acknowledge that pre and post exercise glucose checks will be done for 3 sessions at entry of program.   Know Your Numbers and Risk Factors: -Group verbal and written instruction about important numbers in your health.  Discussion of what are risk factors and how they play a role in the disease process.  Review of Cholesterol, Blood Pressure, Diabetes, and BMI and the role they play in your overall health.   Cardiac Rehab from 04/10/2019 in [facility] Cardiac and Pulmonary Rehab  Date  04/10/19  Educator  [facility]   Instruction Review Code  1- Verbalizes Understanding      Sleep Hygiene: -Provides group verbal and written instruction about how sleep can affect your health.  Define sleep hygiene, discuss sleep cycles and impact of sleep habits. Review good sleep hygiene tips.    Other: -Provides group and verbal instruction on various topics (see comments)   Knowledge Questionnaire Score:   Core Components/Risk Factors/Patient Goals at Admission: Personal Goals and Risk Factors at Admission - 02/27/19 1112      Core Components/Risk Factors/Patient Goals on Admission    Weight Management  Yes;Weight Loss    Intervention  Weight Management: Develop a combined nutrition and exercise program designed to reach desired caloric intake, while maintaining appropriate intake of nutrient and fiber, sodium and fats, and appropriate energy expenditure required for the weight goal.;Weight Management: Provide education and appropriate resources to help participant work on and attain dietary goals.    Admit Weight  202 lb 1.6 oz (91.7 kg)    Goal Weight: Short Term  195 lb (88.5 kg)    Goal Weight: Long Term  190 lb (86.2 kg)    Expected Outcomes  Short Term: Continue to assess and modify interventions until short term weight is achieved;Long Term: Adherence to nutrition and physical activity/exercise program aimed toward attainment of established weight goal;Weight Loss: Understanding of general recommendations for a balanced deficit meal plan, which promotes 1-2 lb weight loss per week and includes a negative energy balance of [phone] kcal/d;Understanding recommendations for meals to include 15-35% energy as protein, 25-35% energy from fat, 35-60% energy from carbohydrates, less than '200mg'$  of dietary cholesterol, 20-35 gm of total fiber daily;Understanding of distribution of calorie intake throughout the day with the consumption of 4-5 meals/snacks    Heart Failure  Yes    Intervention  Provide a combined exercise  and nutrition program that is supplemented with education, support and counseling about heart failure. Directed toward relieving symptoms such as shortness of breath, decreased exercise tolerance, and extremity edema.    Expected Outcomes  Improve functional capacity of life;Short term: Attendance in program 2-3 days a week with increased exercise capacity. Reported lower sodium intake. Reported increased fruit and vegetable intake. Reports medication compliance.;Short term: Daily weights obtained and reported for increase. Utilizing diuretic protocols set by physician.;Long term: Adoption of self-care skills and reduction of barriers for early signs and symptoms recognition and intervention  leading to self-care maintenance.    Lipids  Yes    Intervention  Provide education and support for participant on nutrition & aerobic/resistive exercise along with prescribed medications to achieve LDL '70mg'$ , HDL >'40mg'$ .    Expected Outcomes  Short Term: Participant states understanding of desired cholesterol values and is compliant with medications prescribed. Participant is following exercise prescription and nutrition guidelines.;Long Term: Cholesterol controlled with medications as prescribed, with individualized exercise RX and with personalized nutrition plan. Value goals: LDL < '70mg'$ , HDL > 40 mg.       Core Components/Risk Factors/Patient Goals Review:  Goals and Risk Factor Review    Row Name 03/13/19 0748 04/03/19 0744 04/29/19 0815 05/27/19 0746       Core Components/Risk Factors/Patient Goals Review   Personal Goals Review  Weight Management/Obesity;Hypertension;Heart Failure;Lipids  Weight Management/Obesity;Hypertension;Heart Failure;Lipids  Weight Management/Obesity;Improve shortness of breath with ADL's  Weight Management/Obesity;Improve shortness of breath with ADL's;Hypertension    Review  Avyukth is doing well with his weight. He checks it in class as he doesn't check it at home as he doesn't have a  scale.  He will let us know about the scale.  Reviewed heart failure action plan with him today.  He is watching his sodium levels.  His pressures have been good in class as he does not have a cuff.  He will look into it.  We can into foundation for providing these to him.  He is doing well with his medicaitons.  Zavier is staying steady with his weight around 202 lbs.  He still has not gotten a scale or cuff so he only checks them here.  His pressures have been good in class.  He has not had any heart failure symptoms.  We will look into foundation for help with scale and cuff to heart failure management.  Stefan wants to lose some more weight and be around 190 pounds. He has been eating healthy but eating to much. Informed him to drink some water before a meal and wait after heats his plate to let his food settle. He is going to try to lose 5 pounds in the next two weeks.  Huie is doing well.  His weight is staying around 200-205 lbs.  He continues to eat better and drinks lots of water.  He continues to try to lose weight. His breathing is getting better and now its just the hard work that gets him.  His pressures have been good in class (he does not check at home).  He had a follow up last week and the doctor was pleased with his progress.    Expected Outcomes  Short: Look into getting scale and cuff for home use.  Long: Continue to monitor risk factors.  Short: talk to foundation.  Long: Continue to montior heart failure.  Short: lose 5 pounds in two weeks. Long: maintain weight loss independently.  Short: Continue to work on weight loss.  Long: Continue to monitor risk factors.       Core Components/Risk Factors/Patient Goals at Discharge (Final Review):  Goals and Risk Factor Review - 05/27/19 0746      Core Components/Risk Factors/Patient Goals Review   Personal Goals Review  Weight Management/Obesity;Improve shortness of breath with ADL's;Hypertension    Review  Ladarrian is doing well.  His weight is  staying around 200-205 lbs.  He continues to eat better and drinks lots of water.  He continues to try to lose weight. His breathing is getting better and  now its just the hard work that gets him.  His pressures have been good in class (he does not check at home).  He had a follow up last week and the doctor was pleased with his progress.    Expected Outcomes  Short: Continue to work on weight loss.  Long: Continue to monitor risk factors.       ITP Comments: ITP Comments    Row Name 02/26/19 1043 02/27/19 1052 03/26/19 1311 04/23/19 0634 05/21/19 1012   ITP Comments  Virtual initial orientation completed. Diagnosis can be found in CE 8/26. EP/RD orientation scheduled for 9/10 at 9:30  Completed 6MWT, gym orientation, and RD evaluation. Initial ITP created and sent for review to Dr. Emily Filbert, Medical Director.  30 day review completed. ITP sent to Dr. Emily Filbert, Medical Director of Cardiac and Pulmonary Rehab. Continue with ITP unless changes are made by physician.  Department closed starting 10/2 until further notice by infection prevention and Health at Work teams for COVID-19.  30 day review completed. Continue with ITP sent to Dr. Emily Filbert, Medical Director of Cardiac and Pulmonary Rehab for review , changes as needed and signature.  30 day review competed . ITP sent to Dr Emily Filbert for review, changes as needed and ITP approval signature.   [place] Name 06/18/19 0850 06/27/19 1022 07/03/19 0854 07/16/19 1125 07/16/19 1126   ITP Comments  30 day review competed . ITP sent to Dr Emily Filbert for review, changes as needed and ITP approval signature  Due to the increase in COVID-19 numbers, Breandan would like to graduate at his next visit.  Virtual call completed today. Calls are done every week that patient is not attending an onsite exercise session during the COVID 19 PHE Crisis.   Today's call included review of :their home exercise    Zarius is using his new [address]  Daily ,except  workdays,with 15 min of weights and stretching while he is not in class.   No voiced concerns today.  Ishmael called out sick on last week when he supposed to graduate.  Left message that we can still finish up this next week instead.  Scheduled for follow up call tomorrow.  30 day review completed. ITP sent to Dr. Emily Filbert, Medical Director of Cardiac and Pulmonary Rehab. Continue with ITP unless changes are made by physician.  Department operating under reduced schedule until further notice by request from hospital leadership.   [place] Name 07/17/19 0815 08/13/19 0647         ITP Comments  Virtual call completed today.  30 day chart review completed. ITP sent to Dr Zachery Dakins Medical Director, for review,changes as needed and signature.         Comments:

## 2019-08-21 ENCOUNTER — Encounter: Payer: Self-pay | Admitting: *Deleted

## 2019-08-21 DIAGNOSIS — Z952 Presence of prosthetic heart valve: Secondary | ICD-10-CM

## 2019-08-21 NOTE — Progress Notes (Signed)
Discharge Progress Report  Patient Details  Name: Brandon Kennedy MRN: [id number] Date of Birth: 06/16/1961 Referring Provider:     Cardiac Rehab from 02/27/2019 in [facility]  Referring Provider  Marisa Severin MD       Number of Visits: 27  Reason for Discharge:  Patient reached a stable level of exercise. Patient independent in their exercise. Patient has met program and personal goals.  Smoking History:  Social History   Tobacco Use  Smoking Status Never Smoker  Smokeless Tobacco Never Used    Diagnosis:  S/P TAVR (transcatheter aortic valve replacement)  ADL UCSD:   Initial Exercise Prescription: Initial Exercise Prescription - 02/27/19 1000      Date of Initial Exercise RX and Referring Provider   Date  02/27/19    Referring Provider  Marisa Severin MD      Treadmill   MPH  3.3    Grade  1    Minutes  15    METs  3.71      REL-XR   Level  3    Speed  50    Minutes  15    METs  3.5      T5 Nustep   Level  4    SPM  80    Minutes  15    METs  3.5      Prescription Details   Frequency (times per week)  2    Duration  Progress to 30 minutes of continuous aerobic without signs/symptoms of physical distress      Intensity   THRR 40-80% of Max Heartrate  115-145    Ratings of Perceived Exertion  11-13    Perceived Dyspnea  0-4      Progression   Progression  Continue to progress workloads to maintain intensity without signs/symptoms of physical distress.      Resistance Training   Training Prescription  Yes    Weight  4 lbs    Reps  10-15       Discharge Exercise Prescription (Final Exercise Prescription Changes): Exercise Prescription Changes - 07/09/19 1400      Response to Exercise   Blood Pressure (Admit)  126/72    Blood Pressure (Exercise)  138/70    Blood Pressure (Exit)  136/74    Heart Rate (Admit)  61 bpm    Heart Rate (Exercise)  137 bpm    Heart Rate (Exit)  89 bpm    Rating of Perceived Exertion  (Exercise)  15    Symptoms  none    Duration  Continue with 30 min of aerobic exercise without signs/symptoms of physical distress.    Intensity  THRR unchanged      Progression   Progression  Continue to progress workloads to maintain intensity without signs/symptoms of physical distress.      Resistance Training   Training Prescription  Yes    Weight  7 lb    Reps  10-15      Interval Training   Interval Training  Yes    Equipment  Treadmill;Elliptical      Treadmill   MPH  3.3    Grade  3   up to 10    Minutes  15    METs  4.89      Elliptical   Level  3    Speed  4    Minutes  15       Functional Capacity: 6 Minute Walk  [place] Name 02/27/19 1052 06/26/19 0756       6 Minute Walk   Phase  Initial  Discharge    Distance  1806 feet  1900 feet    Distance % Change  --  5.2 %    Distance Feet Change  --  94 ft    Walk Time  6 minutes  6 minutes    # of Rest Breaks  0  0    MPH  3.42  3.5    METS  5.13  4.71    RPE  14  15    VO2 Peak  17.97  16.48    Symptoms  No  No    Resting HR  85 bpm  57 bpm    Resting BP  142/86  126/72    Resting Oxygen Saturation   98 %  --    Exercise Oxygen Saturation  during 6 min walk  99 %  --    Max Ex. HR  128 bpm  115 bpm    Max Ex. BP  184/74 recheck BP prior to resistance training 164/72  138/70    2 Minute Post BP  142/74  --       Psychological, QOL, Others - Outcomes: PHQ 2/9: Depression screen PHQ 2/9 02/27/2019  Decreased Interest 0  Down, Depressed, Hopeless 0  PHQ - 2 Score 0  Altered sleeping 1  Tired, decreased energy 1  Change in appetite 0  Feeling bad or failure about yourself  0  Trouble concentrating 0  Moving slowly or fidgety/restless 0  Suicidal thoughts 0  PHQ-9 Score 2  Difficult doing work/chores Not difficult at all    Quality of Life: Quality of Life - 02/27/19 1115      Quality of Life   Select  Quality of Life      Quality of Life Scores   Health/Function Pre  27.83 %     Socioeconomic Pre  28.07 %    Psych/Spiritual Pre  28.29 %    Family Pre  30 %    GLOBAL Pre  28.29 %       Personal Goals: Goals established at orientation with interventions provided to work toward goal. Personal Goals and Risk Factors at Admission - 02/27/19 1112      Core Components/Risk Factors/Patient Goals on Admission    Weight Management  Yes;Weight Loss    Intervention  Weight Management: Develop a combined nutrition and exercise program designed to reach desired caloric intake, while maintaining appropriate intake of nutrient and fiber, sodium and fats, and appropriate energy expenditure required for the weight goal.;Weight Management: Provide education and appropriate resources to help participant work on and attain dietary goals.    Admit Weight  202 lb 1.6 oz (91.7 kg)    Goal Weight: Short Term  195 lb (88.5 kg)    Goal Weight: Long Term  190 lb (86.2 kg)    Expected Outcomes  Short Term: Continue to assess and modify interventions until short term weight is achieved;Long Term: Adherence to nutrition and physical activity/exercise program aimed toward attainment of established weight goal;Weight Loss: Understanding of general recommendations for a balanced deficit meal plan, which promotes 1-2 lb weight loss per week and includes a negative energy balance of [phone] kcal/d;Understanding recommendations for meals to include 15-35% energy as protein, 25-35% energy from fat, 35-60% energy from carbohydrates, less than 239m of dietary cholesterol, 20-35 gm of total fiber daily;Understanding of distribution of calorie intake throughout  the day with the consumption of 4-5 meals/snacks    Heart Failure  Yes    Intervention  Provide a combined exercise and nutrition program that is supplemented with education, support and counseling about heart failure. Directed toward relieving symptoms such as shortness of breath, decreased exercise tolerance, and extremity edema.    Expected Outcomes   Improve functional capacity of life;Short term: Attendance in program 2-3 days a week with increased exercise capacity. Reported lower sodium intake. Reported increased fruit and vegetable intake. Reports medication compliance.;Short term: Daily weights obtained and reported for increase. Utilizing diuretic protocols set by physician.;Long term: Adoption of self-care skills and reduction of barriers for early signs and symptoms recognition and intervention leading to self-care maintenance.    Lipids  Yes    Intervention  Provide education and support for participant on nutrition & aerobic/resistive exercise along with prescribed medications to achieve LDL <57m, HDL >455m    Expected Outcomes  Short Term: Participant states understanding of desired cholesterol values and is compliant with medications prescribed. Participant is following exercise prescription and nutrition guidelines.;Long Term: Cholesterol controlled with medications as prescribed, with individualized exercise RX and with personalized nutrition plan. Value goals: LDL < 7093mHDL > 40 mg.        Personal Goals Discharge: Goals and Risk Factor Review    Row Name 03/13/19 0748 04/03/19 0744 04/29/19 0815 05/27/19 0746       Core Components/Risk Factors/Patient Goals Review   Personal Goals Review  Weight Management/Obesity;Hypertension;Heart Failure;Lipids  Weight Management/Obesity;Hypertension;Heart Failure;Lipids  Weight Management/Obesity;Improve shortness of breath with ADL's  Weight Management/Obesity;Improve shortness of breath with ADL's;Hypertension    Review  MarAshkan doing well with his weight. He checks it in class as he doesn't check it at home as he doesn't have a scale.  He will let us [place] about the scale.  Reviewed heart failure action plan with him today.  He is watching his sodium levels.  His pressures have been good in class as he does not have a cuff.  He will look into it.  We can into foundation for providing these  to him.  He is doing well with his medicaitons.  MarKmarion staying steady with his weight around 202 lbs.  He still has not gotten a scale or cuff so he only checks them here.  His pressures have been good in class.  He has not had any heart failure symptoms.  We will look into foundation for help with scale and cuff to heart failure management.  MarCylernts to lose some more weight and be around 190 pounds. He has been eating healthy but eating to much. Informed him to drink some water before a meal and wait after heats his plate to let his food settle. He is going to try to lose 5 pounds in the next two weeks.  MarCung doing well.  His weight is staying around 200-205 lbs.  He continues to eat better and drinks lots of water.  He continues to try to lose weight. His breathing is getting better and now its just the hard work that gets him.  His pressures have been good in class (he does not check at home).  He had a follow up last week and the doctor was pleased with his progress.    Expected Outcomes  Short: Look into getting scale and cuff for home use.  Long: Continue to monitor risk factors.  Short: talk to foundation.  Long: Continue to montior  heart failure.  Short: lose 5 pounds in two weeks. Long: maintain weight loss independently.  Short: Continue to work on weight loss.  Long: Continue to monitor risk factors.       Exercise Goals and Review: Exercise Goals    Row Name 02/27/19 1109             Exercise Goals   Increase Physical Activity  Yes       Intervention  Provide advice, education, support and counseling about physical activity/exercise needs.;Develop an individualized exercise prescription for aerobic and resistive training based on initial evaluation findings, risk stratification, comorbidities and participant's personal goals.       Expected Outcomes  Short Term: Attend rehab on a regular basis to increase amount of physical activity.;Long Term: Add in home exercise to make  exercise part of routine and to increase amount of physical activity.;Long Term: Exercising regularly at least 3-5 days a week.       Increase Strength and Stamina  Yes       Intervention  Provide advice, education, support and counseling about physical activity/exercise needs.;Develop an individualized exercise prescription for aerobic and resistive training based on initial evaluation findings, risk stratification, comorbidities and participant's personal goals.       Expected Outcomes  Short Term: Increase workloads from initial exercise prescription for resistance, speed, and METs.;Short Term: Perform resistance training exercises routinely during rehab and add in resistance training at home;Long Term: Improve cardiorespiratory fitness, muscular endurance and strength as measured by increased METs and functional capacity (6MWT)       Able to understand and use rate of perceived exertion (RPE) scale  Yes       Intervention  Provide education and explanation on how to use RPE scale       Expected Outcomes  Short Term: Able to use RPE daily in rehab to express subjective intensity level;Long Term:  Able to use RPE to guide intensity level when exercising independently       Knowledge and understanding of Target Heart Rate Range (THRR)  Yes       Intervention  Provide education and explanation of THRR including how the numbers were predicted and where they are located for reference       Expected Outcomes  Short Term: Able to state/look up THRR;Short Term: Able to use daily as guideline for intensity in rehab;Long Term: Able to use THRR to govern intensity when exercising independently       Able to check pulse independently  Yes       Intervention  Provide education and demonstration on how to check pulse in carotid and radial arteries.;Review the importance of being able to check your own pulse for safety during independent exercise       Expected Outcomes  Short Term: Able to explain why pulse  checking is important during independent exercise;Long Term: Able to check pulse independently and accurately       Understanding of Exercise Prescription  Yes       Intervention  Provide education, explanation, and written materials on patient's individual exercise prescription       Expected Outcomes  Long Term: Able to explain home exercise prescription to exercise independently;Short Term: Able to explain program exercise prescription          Exercise Goals Re-Evaluation: Exercise Goals Re-Evaluation    Row Name 03/04/19 0800 03/07/19 0745 03/13/19 0740 03/18/19 1520 04/01/19 1545     Exercise Goal Re-Evaluation   Exercise Goals  Review  Increase Physical Activity;Increase Strength and Stamina;Able to understand and use rate of perceived exertion (RPE) scale;Able to understand and use Dyspnea scale;Knowledge and understanding of Target Heart Rate Range (THRR);Able to check pulse independently;Understanding of Exercise Prescription  Increase Physical Activity;Increase Strength and Stamina;Able to understand and use rate of perceived exertion (RPE) scale;Able to understand and use Dyspnea scale;Knowledge and understanding of Target Heart Rate Range (THRR);Able to check pulse independently;Understanding of Exercise Prescription  Increase Physical Activity;Increase Strength and Stamina;Understanding of Exercise Prescription  Increase Physical Activity;Increase Strength and Stamina;Understanding of Exercise Prescription  Increase Physical Activity;Increase Strength and Stamina;Understanding of Exercise Prescription   Comments  Reviewed RPE scale, THR and program prescription with pt today.  Pt voiced understanding and was given a copy of goals to take home.  Berk is tolerating exercise well.  Staff will continue to monitor progress.  Laron is doing well in rehab. He is off to a good start and already walking at home.  He is starting to recover his strength and stamina.  He is not doing anything too  strenuous yet.  He is fatigued after work for standing and walking for 12 hours.  He naps in the afternoons.  Santos continues to do well in rehab.  He is doing well with his workloads and should be ready to start to move them up!!  We will continue to monitor his progress.  Hendrick has been doing well in rehab.  He has increased almost all of his workloads!  We will continue to monitor his progression.   Expected Outcomes  Short: Use RPE daily to regulate intensity. Long: Follow program prescription in THR.  Short - attend consistently Long - increase overall MET level  Short: Continue to walk on off days.  Long: Continue to increase stamina.  Short: Begin to increase workloads.  Long: Continue to improve stamina.  Short: Review home exercise guidelines.  Long: Continue to improve stamina.   [place] Name 04/03/19 2585 04/15/19 1406 04/29/19 0807 05/13/19 0930 05/27/19 0743     Exercise Goal Re-Evaluation   Exercise Goals Review  Increase Physical Activity;Increase Strength and Stamina;Understanding of Exercise Prescription  Increase Physical Activity;Increase Strength and Stamina;Able to understand and use rate of perceived exertion (RPE) scale;Knowledge and understanding of Target Heart Rate Range (THRR);Able to check pulse independently;Understanding of Exercise Prescription  Increase Physical Activity;Increase Strength and Stamina;Knowledge and understanding of Target Heart Rate Range (THRR)  Increase Physical Activity;Increase Strength and Stamina;Able to understand and use rate of perceived exertion (RPE) scale;Able to understand and use Dyspnea scale;Knowledge and understanding of Target Heart Rate Range (THRR);Able to check pulse independently;Understanding of Exercise Prescription  Increase Physical Activity;Increase Strength and Stamina;Understanding of Exercise Prescription   Comments  Whittaker continues to do well in rehab. He is working hard in class and exercising at home. He also stays active by gardening.   He has noticed that he is now able to do more in his garden without getting as short of breath as during the summer.  Latravion is continuing to attend consistently and work in correct THR and RPE range.  He also exercsises at home.  Candido works 3 days a week for 12 hours shifts. He walks at home and does weights twice a week. He does not exercise on the weekend but he is walking all day long. Sometimes when he is out gardening he over does it but feels like he can do alot more than he used to. He knows his target heart rate  and knows how to check it manually.  Torrie does intervals during cardio sessions and has increased overall MET level.  Staff will monitor progress.  Anthonymichael is doing well in rehab.  He is enjoying intervals.  He is walking at home and using weights.  He has noticed that Mondays are tough after the weekend, but gets back into the flow by Tuesday and Wednesday.  He feels that he has his strength back.   Expected Outcomes  Short: Continue to exericse at home on off day.  Long: Continue to work on stamina in garden.  Short - continue to be consistent and increase workloads Long - increase overall MET level  Short: continue to exercise. Long: graduate [place].  Short - continue interval training Long : complete HT  Short: Continut to add in more exercise at home.  Long: Continue to improve stamina.   [place] Name 06/10/19 1432 06/27/19 1022 07/09/19 1453 07/23/19 1203       Exercise Goal Re-Evaluation   Exercise Goals Review  Increase Physical Activity;Increase Strength and Stamina;Able to understand and use rate of perceived exertion (RPE) scale;Knowledge and understanding of Target Heart Rate Range (THRR);Able to check pulse independently;Understanding of Exercise Prescription  Increase Physical Activity;Increase Strength and Stamina;Understanding of Exercise Prescription  Increase Physical Activity;Increase Strength and Stamina;Able to understand and use rate of perceived exertion (RPE) scale;Able to  understand and use Dyspnea scale;Knowledge and understanding of Target Heart Rate Range (THRR);Able to check pulse independently;Understanding of Exercise Prescription  Increase Physical Activity;Increase Strength and Stamina;Understanding of Exercise Prescription    Comments  Mishael has increased intervals to 9% on TM.  He continues to be consistent in attendance and working on intervals  Estanislado improved his post 6MWT by 55f! He is graduating early due to COVID-19 numbers.  He is has continued to increase his intervals on both the treadmill and elliptical.  He really enjoys the ellipitcal. He is planning to continue to exericse by walking at home and may look into purchasing an elliptical.  --  MRolenhas been out since his last in person appt on 06/26/19.  He will be graduating tomorrow!  He will continue to walk at home.    Expected Outcomes  Short :  continue intervals Long - maintain fitness gains on his own  Short: Continue to exercise on his own. Long: Maintain his new healthier lifestyle.  --  Short: Continue to exercise on his own. Long: Maintain his new healthier lifestyle.       Nutrition & Weight - Outcomes: Pre Biometrics - 02/27/19 1112      Pre Biometrics   Height  5' 11.2" (1.808 m)    Weight  202 lb 1.6 oz (91.7 kg)    BMI (Calculated)  28.04    Single Leg Stand  30 seconds      Post Biometrics - 06/26/19 0757       Post  Biometrics   Height  5' 11.2" (1.808 m)    Weight  203 lb 14.4 oz (92.5 kg)    BMI (Calculated)  28.29    Single Leg Stand  30 seconds       Nutrition: Nutrition Therapy & Goals - 02/27/19 1048      Nutrition Therapy   Diet  low Na, HH diet    Protein (specify units)  95g    Fiber  30 grams    Whole Grain Foods  3 servings    Saturated Fats  12 max. grams  Fruits and Vegetables  5 servings/day    Sodium  1.5 grams      Personal Nutrition Goals   Nutrition Goal  ST: take note of the ranges of Na you have daily  LT: have and improved EF    Comments   Pt and wife used a lot of diet culture buzz words "clean eating", "real food", "good food", "bad food", "chemicals". This RD discussed some myths and facts about food and food balance. Pt reports eating raisin bran with whole milk, either leftovers, whole grain sandwich with [place], or microwave meal (wife reports its a healthy one from the co-op), snacks are usually dry roasted/unsalted nuts or fruit, dinner is either chicken (red meat 2x/week) or plant protein like bean salad, whole grains, and vegetables. Pt and wife report eating mostly fresh foods and food from their garden. They choose healthy fats like olive oil and will use butter (but not as much). on weekends pt will have an egg with an english muffin. Pt wife reports cooking almost all the food from scratch and using salt which she has tried to cut back. Disucssed HH eating, low sodium eating, and fiber. Pt reports he likes what he is eating.      Intervention Plan   Intervention  Prescribe, educate and counsel regarding individualized specific dietary modifications aiming towards targeted core components such as weight, hypertension, lipid management, diabetes, heart failure and other comorbidities.;Nutrition handout(s) given to patient.    Expected Outcomes  Short Term Goal: Understand basic principles of dietary content, such as calories, fat, sodium, cholesterol and nutrients.;Short Term Goal: A plan has been developed with personal nutrition goals set during dietitian appointment.;Long Term Goal: Adherence to prescribed nutrition plan.       Nutrition Discharge: Nutrition Assessments - 02/27/19 1048      MEDFICTS Scores   Pre Score  32       Education Questionnaire Score:   Goals reviewed with patient; copy given to patient.

## 2019-08-21 NOTE — Progress Notes (Signed)
Cardiac Individual Treatment Plan  Patient Details  Name: Brandon Kennedy MRN: [id number] Date of Birth: 04/20/1961 Referring Provider:     Cardiac Rehab from 02/27/2019 in [facility]  Referring Provider  Vavalle, John MD      Initial Encounter Date:    Cardiac Rehab from 02/27/2019 in [facility]  Date  02/27/19      Visit Diagnosis: S/P TAVR (transcatheter aortic valve replacement)  Patient's Home Medications on Admission:  Current Outpatient Medications:  .  acetaminophen (TYLENOL) 500 MG tablet, Take by mouth., Disp: , Rfl:  .  aspirin EC 81 MG tablet, Take by mouth., Disp: , Rfl:  .  losartan (COZAAR) 25 MG tablet, Take by mouth., Disp: , Rfl:  .  metoprolol succinate (TOPROL-XL) 25 MG 24 hr tablet, Take by mouth., Disp: , Rfl:   Past Medical History: No past medical history on file.  Tobacco Use: Social History   Tobacco Use  Smoking Status Never Smoker  Smokeless Tobacco Never Used    Labs: Recent Review Flowsheet Data    There is no flowsheet data to display.       Exercise Target Goals: Exercise Program Goal: Individual exercise prescription set using results from initial 6 min walk test and THRR while considering  patient's activity barriers and safety.   Exercise Prescription Goal: Initial exercise prescription builds to 30-45 minutes a day of aerobic activity, 2-3 days per week.  Home exercise guidelines will be given to patient during program as part of exercise prescription that the participant will acknowledge.  Activity Barriers & Risk Stratification: Activity Barriers & Cardiac Risk Stratification - 02/27/19 1054      Activity Barriers & Cardiac Risk Stratification   Activity Barriers  Arthritis;Joint Problems;Decreased Ventricular Function;Muscular Weakness   bilateral knee pain   Cardiac Risk Stratification  Low       6 Minute Walk: 6 Minute Walk    Row Name 02/27/19 1052 06/26/19 0756        6 Minute Walk   Phase  Initial  Discharge    Distance  1806 feet  1900 feet    Distance % Change  --  5.2 %    Distance Feet Change  --  94 ft    Walk Time  6 minutes  6 minutes    # of Rest Breaks  0  0    MPH  3.42  3.5    METS  5.13  4.71    RPE  14  15    VO2 Peak  17.97  16.48    Symptoms  No  No    Resting HR  85 bpm  57 bpm    Resting BP  142/86  126/72    Resting Oxygen Saturation   98 %  --    Exercise Oxygen Saturation  during 6 min walk  99 %  --    Max Ex. HR  128 bpm  115 bpm    Max Ex. BP  184/74 recheck BP prior to resistance training 164/72  138/70    2 Minute Post BP  142/74  --       Oxygen Initial Assessment:   Oxygen Re-Evaluation:   Oxygen Discharge (Final Oxygen Re-Evaluation):   Initial Exercise Prescription: Initial Exercise Prescription - 02/27/19 1000      Date of Initial Exercise RX and Referring Provider   Date  02/27/19    Referring Provider  Vavalle, John MD        Treadmill   MPH  3.3    Grade  1    Minutes  15    METs  3.71      REL-XR   Level  3    Speed  50    Minutes  15    METs  3.5      T5 Nustep   Level  4    SPM  80    Minutes  15    METs  3.5      Prescription Details   Frequency (times per week)  2    Duration  Progress to 30 minutes of continuous aerobic without signs/symptoms of physical distress      Intensity   THRR 40-80% of Max Heartrate  115-145    Ratings of Perceived Exertion  11-13    Perceived Dyspnea  0-4      Progression   Progression  Continue to progress workloads to maintain intensity without signs/symptoms of physical distress.      Resistance Training   Training Prescription  Yes    Weight  4 lbs    Reps  10-15       Perform Capillary Blood Glucose checks as needed.  Exercise Prescription Changes: Exercise Prescription Changes    Row Name 02/27/19 1100 03/07/19 0700 03/11/19 1400 03/19/19 1400 04/02/19 1000     Response to Exercise   Blood Pressure (Admit)  142/86  128/64  --   94/60  126/66   Blood Pressure (Exercise)  184/74 rck 164/72  146/62  --  140/78  144/64   Blood Pressure (Exit)  142/74  124/70  --  94/56  118/64   Heart Rate (Admit)  85 bpm  68 bpm  --  68 bpm  63 bpm   Heart Rate (Exercise)  128 bpm  127 bpm  --  107 bpm  102 bpm   Heart Rate (Exit)  92 bpm  --  --  71 bpm  83 bpm   Oxygen Saturation (Admit)  98 %  --  --  --  --   Oxygen Saturation (Exercise)  99 %  --  --  --  --   Rating of Perceived Exertion (Exercise)  14  13  --  13  13   Symptoms  none  none  --  none  none   Comments  walk test results  --  --  --  --   Duration  --  Continue with 30 min of aerobic exercise without signs/symptoms of physical distress.  --  Continue with 30 min of aerobic exercise without signs/symptoms of physical distress.  Continue with 30 min of aerobic exercise without signs/symptoms of physical distress.   Intensity  --  THRR unchanged  --  THRR unchanged  THRR unchanged     Progression   Progression  --  --  --  Continue to progress workloads to maintain intensity without signs/symptoms of physical distress.  Continue to progress workloads to maintain intensity without signs/symptoms of physical distress.   Average METs  --  --  --  3.69  3.33     Resistance Training   Training Prescription  --  Yes  --  Yes  Yes   Weight  --  4 lb  --  5 lbs  5 lbs   Reps  --  10-15  --  10-15  10-15     Interval Training   Interval Training  --  No  --  --    No     Treadmill   MPH  --  3  --  3.3  3.3   Grade  --  1  --  1  1   Minutes  --  15  --  15  15   METs  --  3.71  --  3.98  3.98     REL-XR   Level  --  3  --  8  8   Speed  --  50  --  --  --   Minutes  --  15  --  15  15   METs  --  2.8  --  4.8  3.1     T5 Nustep   Level  --  4  --  5  5   SPM  --  80  --  --  --   Minutes  --  15  --  15  15   METs  --  2.7  --  2.3  2.9     Home Exercise Plan   Plans to continue exercise at  --  --  Home (comment)  Home (comment)  Home (comment)    Frequency  --  --  Add 3 additional days to program exercise sessions.  Add 3 additional days to program exercise sessions.  Add 3 additional days to program exercise sessions.   Initial Home Exercises Provided  --  --  03/11/19  03/11/19  03/11/19   Row Name 04/15/19 1400 04/30/19 1200 05/13/19 0900 05/28/19 1500 06/10/19 1400     Response to Exercise   Blood Pressure (Admit)  110/60  122/54  126/58  134/70  132/74   Blood Pressure (Exercise)  142/72  132/62  150/64  142/60  144/74   Blood Pressure (Exit)  104/64  130/64  128/74  136/70  122/64   Heart Rate (Admit)  57 bpm  65 bpm  66 bpm  120 bpm  63 bpm   Heart Rate (Exercise)  123 bpm  117 bpm  123 bpm  128 bpm  130 bpm   Heart Rate (Exit)  72 bpm  77 bpm  82 bpm  93 bpm  94 bpm   Rating of Perceived Exertion (Exercise)  13  15  15  15  15   Symptoms  none  none  none  none  none   Duration  Continue with 30 min of aerobic exercise without signs/symptoms of physical distress.  Continue with 30 min of aerobic exercise without signs/symptoms of physical distress.  Continue with 30 min of aerobic exercise without signs/symptoms of physical distress.  Continue with 30 min of aerobic exercise without signs/symptoms of physical distress.  Continue with 30 min of aerobic exercise without signs/symptoms of physical distress.   Intensity  THRR unchanged  THRR unchanged  THRR unchanged  THRR unchanged  THRR unchanged     Progression   Progression  Continue to progress workloads to maintain intensity without signs/symptoms of physical distress.  Continue to progress workloads to maintain intensity without signs/symptoms of physical distress.  Continue to progress workloads to maintain intensity without signs/symptoms of physical distress.  Continue to progress workloads to maintain intensity without signs/symptoms of physical distress.  Continue to progress workloads to maintain intensity without signs/symptoms of physical distress.   Average METs  3.6   5.87  4.7  6.9  6     Resistance Training   Training Prescription  Yes  Yes  Yes  Yes    Yes   Weight  5 lb  7 lb  7 lb  7 lb  7 lb   Reps  10-15  10-15  10-15  10-15  10-15     Interval Training   Interval Training  No  Yes  Yes  Yes  --   Equipment  --  Treadmill;REL-XR;T5 Nustep  Treadmill;Recumbant Elliptical;T5 Nustep  Treadmill;Recumbant Elliptical;T5 Nustep  --   Comments  --  2 min off 30 sec on (1:1 on TM)  2 mon off 30 sec on  2 min off 30 sec on  --     Treadmill   MPH  3.3  3.3  --  3.3  3.3   Grade  1  7  --  7.5  9   Minutes  15  15  --  15  15   METs  3.98  6.4  --  6.9  6.9     Elliptical   Level  --  --  --  2  3   Speed  --  --  --  4  4   Minutes  --  --  --  15  15     REL-XR   Level  8  10  10  --  --   Minutes  15  15  15  --  --   METs  4.8  7.4  6.7  --  --     T5 Nustep   Level  6  6  7  --  --   Minutes  15  15  15  --  --   METs  2.4  3.5  2.7  --  --     Home Exercise Plan   Plans to continue exercise at  Home (comment)  Home (comment)  Home (comment)  Home (comment)  Home (comment)   Frequency  Add 3 additional days to program exercise sessions.  Add 3 additional days to program exercise sessions.  Add 3 additional days to program exercise sessions.  Add 3 additional days to program exercise sessions.  Add 3 additional days to program exercise sessions.   Initial Home Exercises Provided  --  03/11/19  03/11/19  03/11/19  03/11/19   Row Name 06/27/19 1000 07/09/19 1400           Response to Exercise   Blood Pressure (Admit)  128/64  126/72      Blood Pressure (Exercise)  164/72  138/70      Blood Pressure (Exit)  126/70  136/74      Heart Rate (Admit)  76 bpm  61 bpm      Heart Rate (Exercise)  130 bpm  137 bpm      Heart Rate (Exit)  96 bpm  89 bpm      Rating of Perceived Exertion (Exercise)  15  15      Symptoms  none  none      Duration  Continue with 30 min of aerobic exercise without signs/symptoms of physical distress.  Continue with  30 min of aerobic exercise without signs/symptoms of physical distress.      Intensity  THRR unchanged  THRR unchanged        Progression   Progression  Continue to progress workloads to maintain intensity without signs/symptoms of physical distress.  Continue to progress workloads to maintain intensity without signs/symptoms of physical distress.      Average METs  8.1  --          Resistance Training   Training Prescription  Yes  Yes      Weight  7 lb  7 lb      Reps  10-15  10-15        Interval Training   Interval Training  Yes  Yes      Equipment  Treadmill;Elliptical  Treadmill;Elliptical      Comments  1 min on 1 min off  --        Treadmill   MPH  3.3  3.3      Grade  10  3 up to 10       Minutes  15  15      METs  8.1  4.89        Elliptical   Level  10  3      Speed  4.5  4      Minutes  15  15        Home Exercise Plan   Plans to continue exercise at  Home (comment)  --      Frequency  Add 3 additional days to program exercise sessions.  --      Initial Home Exercises Provided  03/11/19  --         Exercise Comments: Exercise Comments    Row Name 03/04/19 0800 07/03/19 0854         Exercise Comments  First full day of exercise!  Patient was oriented to gym and equipment including functions, settings, policies, and procedures.  Patient's individual exercise prescription and treatment plan were reviewed.  All starting workloads were established based on the results of the 6 minute walk test done at initial orientation visit.  The plan for exercise progression was also introduced and progression will be customized based on patient's performance and goals.  Virtual call completed today. Calls are done every week that patient is not attending an onsite exercise session during the COVID 19 PHE Crisis.   Today's call included review of :their home exercise    Nuh is using his new [address]  Daily ,except workdays,with 15 min of weights and stretching while he is  not in class.   No voiced concerns today.         Exercise Goals and Review: Exercise Goals    Row Name 02/27/19 1109             Exercise Goals   Increase Physical Activity  Yes       Intervention  Provide advice, education, support and counseling about physical activity/exercise needs.;Develop an individualized exercise prescription for aerobic and resistive training based on initial evaluation findings, risk stratification, comorbidities and participant's personal goals.       Expected Outcomes  Short Term: Attend rehab on a regular basis to increase amount of physical activity.;Long Term: Add in home exercise to make exercise part of routine and to increase amount of physical activity.;Long Term: Exercising regularly at least 3-5 days a week.       Increase Strength and Stamina  Yes       Intervention  Provide advice, education, support and counseling about physical activity/exercise needs.;Develop an individualized exercise prescription for aerobic and resistive training based on initial evaluation findings, risk stratification, comorbidities and participant's personal goals.       Expected Outcomes  Short Term: Increase workloads from initial exercise prescription for resistance, speed, and METs.;Short Term: Perform resistance training exercises routinely during rehab and add in resistance training at home;Long   Term: Improve cardiorespiratory fitness, muscular endurance and strength as measured by increased METs and functional capacity (6MWT)       Able to understand and use rate of perceived exertion (RPE) scale  Yes       Intervention  Provide education and explanation on how to use RPE scale       Expected Outcomes  Short Term: Able to use RPE daily in rehab to express subjective intensity level;Long Term:  Able to use RPE to guide intensity level when exercising independently       Knowledge and understanding of Target Heart Rate Range (THRR)  Yes       Intervention  Provide  education and explanation of THRR including how the numbers were predicted and where they are located for reference       Expected Outcomes  Short Term: Able to state/look up THRR;Short Term: Able to use daily as guideline for intensity in rehab;Long Term: Able to use THRR to govern intensity when exercising independently       Able to check pulse independently  Yes       Intervention  Provide education and demonstration on how to check pulse in carotid and radial arteries.;Review the importance of being able to check your own pulse for safety during independent exercise       Expected Outcomes  Short Term: Able to explain why pulse checking is important during independent exercise;Long Term: Able to check pulse independently and accurately       Understanding of Exercise Prescription  Yes       Intervention  Provide education, explanation, and written materials on patient's individual exercise prescription       Expected Outcomes  Long Term: Able to explain home exercise prescription to exercise independently;Short Term: Able to explain program exercise prescription          Exercise Goals Re-Evaluation : Exercise Goals Re-Evaluation    Row Name 03/04/19 0800 03/07/19 0745 03/13/19 0740 03/18/19 1520 04/01/19 1545     Exercise Goal Re-Evaluation   Exercise Goals Review  Increase Physical Activity;Increase Strength and Stamina;Able to understand and use rate of perceived exertion (RPE) scale;Able to understand and use Dyspnea scale;Knowledge and understanding of Target Heart Rate Range (THRR);Able to check pulse independently;Understanding of Exercise Prescription  Increase Physical Activity;Increase Strength and Stamina;Able to understand and use rate of perceived exertion (RPE) scale;Able to understand and use Dyspnea scale;Knowledge and understanding of Target Heart Rate Range (THRR);Able to check pulse independently;Understanding of Exercise Prescription  Increase Physical Activity;Increase  Strength and Stamina;Understanding of Exercise Prescription  Increase Physical Activity;Increase Strength and Stamina;Understanding of Exercise Prescription  Increase Physical Activity;Increase Strength and Stamina;Understanding of Exercise Prescription   Comments  Reviewed RPE scale, THR and program prescription with pt today.  Pt voiced understanding and was given a copy of goals to take home.  Jaxx is tolerating exercise well.  Staff will continue to monitor progress.  Kentrail is doing well in rehab. He is off to a good start and already walking at home.  He is starting to recover his strength and stamina.  He is not doing anything too strenuous yet.  He is fatigued after work for standing and walking for 12 hours.  He naps in the afternoons.  Daire continues to do well in rehab.  He is doing well with his workloads and should be ready to start to move them up!!  We will continue to monitor his progress.  Tamar has been doing well in   rehab.  He has increased almost all of his workloads!  We will continue to monitor his progression.   Expected Outcomes  Short: Use RPE daily to regulate intensity. Long: Follow program prescription in THR.  Short - attend consistently Long - increase overall MET level  Short: Continue to walk on off days.  Long: Continue to increase stamina.  Short: Begin to increase workloads.  Long: Continue to improve stamina.  Short: Review home exercise guidelines.  Long: Continue to improve stamina.   [place] Name 04/03/19 8315 04/15/19 1406 04/29/19 0807 05/13/19 0930 05/27/19 0743     Exercise Goal Re-Evaluation   Exercise Goals Review  Increase Physical Activity;Increase Strength and Stamina;Understanding of Exercise Prescription  Increase Physical Activity;Increase Strength and Stamina;Able to understand and use rate of perceived exertion (RPE) scale;Knowledge and understanding of Target Heart Rate Range (THRR);Able to check pulse independently;Understanding of Exercise Prescription  Increase  Physical Activity;Increase Strength and Stamina;Knowledge and understanding of Target Heart Rate Range (THRR)  Increase Physical Activity;Increase Strength and Stamina;Able to understand and use rate of perceived exertion (RPE) scale;Able to understand and use Dyspnea scale;Knowledge and understanding of Target Heart Rate Range (THRR);Able to check pulse independently;Understanding of Exercise Prescription  Increase Physical Activity;Increase Strength and Stamina;Understanding of Exercise Prescription   Comments  Ulyess continues to do well in rehab. He is working hard in class and exercising at home. He also stays active by gardening.  He has noticed that he is now able to do more in his garden without getting as short of breath as during the summer.  Kendale is continuing to attend consistently and work in correct THR and RPE range.  He also exercsises at home.  Jlyn works 3 days a week for 12 hours shifts. He walks at home and does weights twice a week. He does not exercise on the weekend but he is walking all day long. Sometimes when he is out gardening he over does it but feels like he can do alot more than he used to. He knows his target heart rate and knows how to check it manually.  Trevontae does intervals during cardio sessions and has increased overall MET level.  Staff will monitor progress.  Tyrian is doing well in rehab.  He is enjoying intervals.  He is walking at home and using weights.  He has noticed that Mondays are tough after the weekend, but gets back into the flow by Tuesday and Wednesday.  He feels that he has his strength back.   Expected Outcomes  Short: Continue to exericse at home on off day.  Long: Continue to work on stamina in garden.  Short - continue to be consistent and increase workloads Long - increase overall MET level  Short: continue to exercise. Long: graduate [employer].  Short - continue interval training Long : complete HT  Short: Continut to add in more exercise at home.  Long:  Continue to improve stamina.   [place] Name 06/10/19 1432 06/27/19 1022 07/09/19 1453 07/23/19 1203       Exercise Goal Re-Evaluation   Exercise Goals Review  Increase Physical Activity;Increase Strength and Stamina;Able to understand and use rate of perceived exertion (RPE) scale;Knowledge and understanding of Target Heart Rate Range (THRR);Able to check pulse independently;Understanding of Exercise Prescription  Increase Physical Activity;Increase Strength and Stamina;Understanding of Exercise Prescription  Increase Physical Activity;Increase Strength and Stamina;Able to understand and use rate of perceived exertion (RPE) scale;Able to understand and use Dyspnea scale;Knowledge and understanding of Target Heart Rate  Range (THRR);Able to check pulse independently;Understanding of Exercise Prescription  Increase Physical Activity;Increase Strength and Stamina;Understanding of Exercise Prescription    Comments  Asuncion has increased intervals to 9% on TM.  He continues to be consistent in attendance and working on intervals  Javonn improved his post 6MWT by 90f! He is graduating early due to COVID-19 numbers.  He is has continued to increase his intervals on both the treadmill and elliptical.  He really enjoys the ellipitcal. He is planning to continue to exericse by walking at home and may look into purchasing an elliptical.  --  MJishnuhas been out since his last in person appt on 06/26/19.  He will be graduating tomorrow!  He will continue to walk at home.    Expected Outcomes  Short :  continue intervals Long - maintain fitness gains on his own  Short: Continue to exercise on his own. Long: Maintain his new healthier lifestyle.  --  Short: Continue to exercise on his own. Long: Maintain his new healthier lifestyle.       Discharge Exercise Prescription (Final Exercise Prescription Changes): Exercise Prescription Changes - 07/09/19 1400      Response to Exercise   Blood Pressure (Admit)  126/72    Blood  Pressure (Exercise)  138/70    Blood Pressure (Exit)  136/74    Heart Rate (Admit)  61 bpm    Heart Rate (Exercise)  137 bpm    Heart Rate (Exit)  89 bpm    Rating of Perceived Exertion (Exercise)  15    Symptoms  none    Duration  Continue with 30 min of aerobic exercise without signs/symptoms of physical distress.    Intensity  THRR unchanged      Progression   Progression  Continue to progress workloads to maintain intensity without signs/symptoms of physical distress.      Resistance Training   Training Prescription  Yes    Weight  7 lb    Reps  10-15      Interval Training   Interval Training  Yes    Equipment  Treadmill;Elliptical      Treadmill   MPH  3.3    Grade  3   up to 10    Minutes  15    METs  4.89      Elliptical   Level  3    Speed  4    Minutes  15       Nutrition:  Target Goals: Understanding of nutrition guidelines, daily intake of sodium '1500mg'$ , cholesterol '200mg'$ , calories 30% from fat and 7% or less from saturated fats, daily to have 5 or more servings of fruits and vegetables.  Biometrics: Pre Biometrics - 02/27/19 1112      Pre Biometrics   Height  5' 11.2" (1.808 m)    Weight  202 lb 1.6 oz (91.7 kg)    BMI (Calculated)  28.04    Single Leg Stand  30 seconds      Post Biometrics - 06/26/19 0757       Post  Biometrics   Height  5' 11.2" (1.808 m)    Weight  203 lb 14.4 oz (92.5 kg)    BMI (Calculated)  28.29    Single Leg Stand  30 seconds       Nutrition Therapy Plan and Nutrition Goals: Nutrition Therapy & Goals - 02/27/19 1048      Nutrition Therapy   Diet  low Na, HH diet  Protein (specify units)  95g    Fiber  30 grams    Whole Grain Foods  3 servings    Saturated Fats  12 max. grams    Fruits and Vegetables  5 servings/day    Sodium  1.5 grams      Personal Nutrition Goals   Nutrition Goal  ST: take note of the ranges of Na you have daily  LT: have and improved EF    Comments  Pt and wife used a lot of diet  culture buzz words "clean eating", "real food", "good food", "bad food", "chemicals". This RD discussed some myths and facts about food and food balance. Pt reports eating raisin bran with whole milk, either leftovers, whole grain sandwich with [place], or microwave meal (wife reports its a healthy one from the co-op), snacks are usually dry roasted/unsalted nuts or fruit, dinner is either chicken (red meat 2x/week) or plant protein like bean salad, whole grains, and vegetables. Pt and wife report eating mostly fresh foods and food from their garden. They choose healthy fats like olive oil and will use butter (but not as much). on weekends pt will have an egg with an english muffin. Pt wife reports cooking almost all the food from scratch and using salt which she has tried to cut back. Disucssed HH eating, low sodium eating, and fiber. Pt reports he likes what he is eating.      Intervention Plan   Intervention  Prescribe, educate and counsel regarding individualized specific dietary modifications aiming towards targeted core components such as weight, hypertension, lipid management, diabetes, heart failure and other comorbidities.;Nutrition handout(s) given to patient.    Expected Outcomes  Short Term Goal: Understand basic principles of dietary content, such as calories, fat, sodium, cholesterol and nutrients.;Short Term Goal: A plan has been developed with personal nutrition goals set during dietitian appointment.;Long Term Goal: Adherence to prescribed nutrition plan.       Nutrition Assessments: Nutrition Assessments - 02/27/19 1048      MEDFICTS Scores   Pre Score  32       Nutrition Goals Re-Evaluation: Nutrition Goals Re-Evaluation    Row Name 04/22/19 1131 05/13/19 0812 05/29/19 0814 06/26/19 0802       Goals   Nutrition Goal  ST: take note of the ranges of Na you have daily  LT: have and improved EF  ST: Be mindful of hunger scale LT: have improved EF  ST: Continue with current chnages  LT: have improved EF  ST: Continue with current changes LT: have improved EF    Comment  Continue with current changes  more aware of sodium, now uses lower salt in cooking. Talked about frozen meals over the weekend with wife. Thinks he eats too much on the weekend. bean and quinoa salads. using spices rather than salt. wife made broccoli salad.  Pt reports doing well with diet, really enjoys wifes broccoli salad that has a variety of fruits and vegetables and reports it is very filling. Pt reports listening to his hunger cues and monitoring his Na. Pt feels he didn't eat so HH this weekend due to neighbor prepparing homemade smoked BBQ- discussed balance and overall habits more important than individual day (pt reports that he does not have this normally).  Pt reports doing well with diet, really enjoys wifes broccoli salad that has a variety of fruits and vegetables and reports it is very filling. Pt reports that he feels he eats too much, discussed honoring hunger and  the hunger/fullness scale as well as practical hunger.  Pt reports no questions at this time.    Expected Outcome  ST: take note of the ranges of Na you have daily  LT: have and improved EF  ST: Be mindful of hunger scale LT: have improved EF  ST: Continue with current chnages LT: have improved EF  ST: Continue with current changes LT: have improved EF       Nutrition Goals Discharge (Final Nutrition Goals Re-Evaluation): Nutrition Goals Re-Evaluation - 06/26/19 0802      Goals   Nutrition Goal  ST: Continue with current changes LT: have improved EF    Comment  Pt reports doing well with diet, really enjoys wifes broccoli salad that has a variety of fruits and vegetables and reports it is very filling. Pt reports that he feels he eats too much, discussed honoring hunger and the hunger/fullness scale as well as practical hunger.  Pt reports no questions at this time.    Expected Outcome  ST: Continue with current changes LT: have  improved EF       Psychosocial: Target Goals: Acknowledge presence or absence of significant depression and/or stress, maximize coping skills, provide positive support system. Participant is able to verbalize types and ability to use techniques and skills needed for reducing stress and depression.   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 He is glad to be rid of his LifeVest, but wanting to make sure his heart is okay. He and his wife are making lifestyle changes and he wants to get more education regarding his health's future.      Family Dynamics   Good Support System?  Yes      Barriers   Psychosocial barriers to participate in program  There are no identifiable barriers or psychosocial needs.;The patient should benefit from training in stress management and relaxation.      Screening Interventions   Interventions  Encouraged to exercise;To provide support and resources with identified psychosocial needs;Provide feedback about the scores to participant    Expected Outcomes  Short Term goal: Utilizing psychosocial counselor, staff and physician to assist with identification of specific Stressors or current issues interfering with healing process. Setting desired goal for each stressor or current issue identified.;Long Term Goal: Stressors or current issues are controlled or eliminated.;Short Term goal: Identification and review with participant of any Quality of Life or Depression concerns found by scoring the questionnaire.;Long Term goal: The participant improves quality of Life and PHQ9 Scores as seen by post scores and/or verbalization of changes       Quality of Life Scores:  Quality of Life - 02/27/19 1115      Quality of Life   Select  Quality of Life      Quality of Life  Scores   Health/Function Pre  27.83 %    Socioeconomic Pre  28.07 %    Psych/Spiritual Pre  28.29 %    Family Pre  30 %    GLOBAL Pre  28.29 %      Scores of 19 and below usually indicate a poorer quality of life in these areas.  A difference of  2-3 points is a clinically meaningful difference.  A difference of 2-3 points  in the total score of the Quality of Life Index has been associated with significant improvement in overall quality of life, self-image, physical symptoms, and general health in studies assessing change in quality of life.  PHQ-9: Recent Review Flowsheet Data    Depression screen [facility] 2/9 02/27/2019   Decreased Interest 0   Down, Depressed, Hopeless 0   PHQ - 2 Score 0   Altered sleeping 1   Tired, decreased energy 1   Change in appetite 0   Feeling bad or failure about yourself  0   Trouble concentrating 0   Moving slowly or fidgety/restless 0   Suicidal thoughts 0   PHQ-9 Score 2   Difficult doing work/chores Not difficult at all     Interpretation of Total Score  Total Score Depression Severity:  1-4 = Minimal depression, 5-9 = Mild depression, 10-14 = Moderate depression, 15-19 = Moderately severe depression, 20-27 = Severe depression   Psychosocial Evaluation and Intervention: Psychosocial Evaluation - 06/27/19 1030      Discharge Psychosocial Assessment & Intervention   Comments  Manoj will be graduating at his next visit.  His wife is not comfortable with him coming out to the hospital as the numbers are rising.  He has enjoyed rehab and learned his limits.  He feels better when he exercises. He plans to continue to exericse by walking at home and maybe getting an elliptical.       Psychosocial Re-Evaluation: Psychosocial Re-Evaluation    Row Name 03/13/19 0745 04/03/19 0735 04/29/19 0812 05/27/19 0745       Psychosocial Re-Evaluation   Current issues with  Current Sleep Concerns  Current Sleep Concerns  Current Sleep Concerns  Current Sleep  Concerns    Comments  Laith is doing well overall.  He is fatigued in afternoon after working 12 hour shifts.  He usually will sleep in 4 hour bouts and then wake and go back to sleep.  He used to work third shift and it still sticks with him.  Overall, he is doing well.  Kmarion is doing well overall. He has no major stressors.  He is still waking up in the early hours and tosses and turns for a bit.  He will go out to work in garden and then nap in afternoon to get enough sleep.  He is still having a hard time adjusting to his 12 hour days again at work.  Kenechukwu is trying to go to bed early since he is know on day shift. He has been on nights for 22 years. He gets tired in the afternoon and takes a nap. Informed patient that it will take a little time to get back into a regular sleep cycle after being on night shift for so long. Patient verbalizes understanding.  Oseias is doing well mentally.  He still is only getting a solid 4 hours and then wakes.  He has stopped his afternoon naps.  Mondays are his toughest due to his work schedule.  Overall his is in a good place mentally.  He is doing well at work.  He walks a lot at work and it hurts his knees and feet.  He wears three pairs of boots to rotate through.    Expected Outcomes  Short: Continue to try to sleep better and get his 6-7 hours.  Long: Continue to make time for self and exercise.  Short: Continue to try to sleep better and get his 6-7 hours.  Long: Continue to make time  for self and exercise.  Short: continue to go to bed early. Long: get on a regular sleep cycle.  Short: Continue to work on sleep.  Long: Continue to sleep better.    Interventions  Encouraged to attend Cardiac Rehabilitation for the exercise  Encouraged to attend Cardiac Rehabilitation for the exercise  Encouraged to attend Cardiac Rehabilitation for the exercise  Encouraged to attend Cardiac Rehabilitation for the exercise    Continue Psychosocial Services   Follow up required by staff   Follow up required by staff  Follow up required by staff  Follow up required by staff       Psychosocial Discharge (Final Psychosocial Re-Evaluation): Psychosocial Re-Evaluation - 05/27/19 0745      Psychosocial Re-Evaluation   Current issues with  Current Sleep Concerns    Comments  Eidan is doing well mentally.  He still is only getting a solid 4 hours and then wakes.  He has stopped his afternoon naps.  Mondays are his toughest due to his work schedule.  Overall his is in a good place mentally.  He is doing well at work.  He walks a lot at work and it hurts his knees and feet.  He wears three pairs of boots to rotate through.    Expected Outcomes  Short: Continue to work on sleep.  Long: Continue to sleep better.    Interventions  Encouraged to attend Cardiac Rehabilitation for the exercise    Continue Psychosocial Services   Follow up required by staff       Vocational Rehabilitation: Provide vocational rehab assistance to qualifying candidates.   Vocational Rehab Evaluation & Intervention: Vocational Rehab - 02/26/19 1039      Initial Vocational Rehab Evaluation & Intervention   Assessment shows need for Vocational Rehabilitation  No       Education: Education Goals: Education classes will be provided on a variety of topics geared toward better understanding of heart health and risk factor modification. Participant will state understanding/return demonstration of topics presented as noted by education test scores.  Learning Barriers/Preferences: Learning Barriers/Preferences - 02/26/19 1039      Learning Barriers/Preferences   Learning Barriers  None    Learning Preferences  None       Education Topics:  AED/CPR: - Group verbal and written instruction with the use of models to demonstrate the basic use of the AED with the basic ABC's of resuscitation.   General Nutrition Guidelines/Fats and Fiber: -Group instruction provided by verbal, written material, models and  posters to present the general guidelines for heart healthy nutrition. Gives an explanation and review of dietary fats and fiber.   Cardiac Rehab from 05/22/2019 in [facility] Cardiac and Pulmonary Rehab  Date  05/08/19  Educator  [facility]  Instruction Review Code  1- Verbalizes Understanding      Controlling Sodium/Reading Food Labels: -Group verbal and written material supporting the discussion of sodium use in heart healthy nutrition. Review and explanation with models, verbal and written materials for utilization of the food label.   Exercise Physiology & General Exercise Guidelines: - Group verbal and written instruction with models to review the exercise physiology of the cardiovascular system and associated critical values. Provides general exercise guidelines with specific guidelines to those with heart or lung disease.    Aerobic Exercise & Resistance Training: - Gives group verbal and written instruction on the various components of exercise. Focuses on aerobic and resistive training programs and the benefits of this training and how to safely  progress through these programs..   Cardiac Rehab from 05/22/2019 in [facility] Cardiac and Pulmonary Rehab  Date  04/24/19  Educator  [facility]  Instruction Review Code  1- Verbalizes Understanding      Flexibility, Balance, Mind/Body Relaxation: Provides group verbal/written instruction on the benefits of flexibility and balance training, including mind/body exercise modes such as yoga, pilates and tai chi.  Demonstration and skill practice provided.   Cardiac Rehab from 05/22/2019 in [facility] Cardiac and Pulmonary Rehab  Date  05/22/19  Educator  [facility]  Instruction Review Code  1- Verbalizes Understanding      Stress and Anxiety: - Provides group verbal and written instruction about the health risks of elevated stress and causes of high stress.  Discuss the correlation between heart/lung disease and anxiety and treatment options. Review healthy ways to manage with  stress and anxiety.   Depression: - Provides group verbal and written instruction on the correlation between heart/lung disease and depressed mood, treatment options, and the stigmas associated with seeking treatment.   Anatomy & Physiology of the Heart: - Group verbal and written instruction and models provide basic cardiac anatomy and physiology, with the coronary electrical and arterial systems. Review of Valvular disease and Heart Failure   Cardiac Procedures: - Group verbal and written instruction to review commonly prescribed medications for heart disease. Reviews the medication, class of the drug, and side effects. Includes the steps to properly store meds and maintain the prescription regimen. (beta blockers and nitrates)   Cardiac Rehab from 05/22/2019 in [facility] Cardiac and Pulmonary Rehab  Date  04/24/19  Educator  [facility]  Instruction Review Code  1- Verbalizes Understanding      Cardiac Medications I: - Group verbal and written instruction to review commonly prescribed medications for heart disease. Reviews the medication, class of the drug, and side effects. Includes the steps to properly store meds and maintain the prescription regimen.   Cardiac Medications II: -Group verbal and written instruction to review commonly prescribed medications for heart disease. Reviews the medication, class of the drug, and side effects. (all other drug classes)   Cardiac Rehab from 04/10/2019 in [facility] Cardiac and Pulmonary Rehab  Date  04/10/19  Educator  [facility]  Instruction Review Code  1- Verbalizes Understanding       Go Sex-Intimacy & Heart Disease, Get SMART - Goal Setting: - Group verbal and written instruction through game format to discuss heart disease and the return to sexual intimacy. Provides group verbal and written material to discuss and apply goal setting through the application of the S.M.A.R.T. Method.   Cardiac Rehab from 05/22/2019 in [facility] Cardiac and Pulmonary Rehab  Date   04/24/19  Educator  [facility]  Instruction Review Code  1- Verbalizes Understanding      Other Matters of the Heart: - Provides group verbal, written materials and models to describe Stable Angina and Peripheral Artery. Includes description of the disease process and treatment options available to the cardiac patient.   Exercise & Equipment Safety: - Individual verbal instruction and demonstration of equipment use and safety with use of the equipment.   Cardiac Rehab from 02/27/2019 in [facility] Cardiac and Pulmonary Rehab  Date  02/27/19  Educator  [facility]  Instruction Review Code  1- Verbalizes Understanding      Infection Prevention: - Provides verbal and written material to individual with discussion of infection control including proper hand washing and proper equipment cleaning during exercise session.   Cardiac Rehab from 02/27/2019 in [facility] Cardiac and  Pulmonary Rehab  Date  02/27/19  Educator  [facility]  Instruction Review Code  1- Verbalizes Understanding      Falls Prevention: - Provides verbal and written material to individual with discussion of falls prevention and safety.   Cardiac Rehab from 02/27/2019 in [facility] Cardiac and Pulmonary Rehab  Date  02/27/19  Educator  [facility]  Instruction Review Code  1- Verbalizes Understanding      Diabetes: - Individual verbal and written instruction to review signs/symptoms of diabetes, desired ranges of glucose level fasting, after meals and with exercise. Acknowledge that pre and post exercise glucose checks will be done for 3 sessions at entry of program.   Know Your Numbers and Risk Factors: -Group verbal and written instruction about important numbers in your health.  Discussion of what are risk factors and how they play a role in the disease process.  Review of Cholesterol, Blood Pressure, Diabetes, and BMI and the role they play in your overall health.   Cardiac Rehab from 04/10/2019 in [facility] Cardiac and Pulmonary Rehab  Date  04/10/19  Educator  [facility]   Instruction Review Code  1- Verbalizes Understanding      Sleep Hygiene: -Provides group verbal and written instruction about how sleep can affect your health.  Define sleep hygiene, discuss sleep cycles and impact of sleep habits. Review good sleep hygiene tips.    Other: -Provides group and verbal instruction on various topics (see comments)   Knowledge Questionnaire Score:   Core Components/Risk Factors/Patient Goals at Admission: Personal Goals and Risk Factors at Admission - 02/27/19 1112      Core Components/Risk Factors/Patient Goals on Admission    Weight Management  Yes;Weight Loss    Intervention  Weight Management: Develop a combined nutrition and exercise program designed to reach desired caloric intake, while maintaining appropriate intake of nutrient and fiber, sodium and fats, and appropriate energy expenditure required for the weight goal.;Weight Management: Provide education and appropriate resources to help participant work on and attain dietary goals.    Admit Weight  202 lb 1.6 oz (91.7 kg)    Goal Weight: Short Term  195 lb (88.5 kg)    Goal Weight: Long Term  190 lb (86.2 kg)    Expected Outcomes  Short Term: Continue to assess and modify interventions until short term weight is achieved;Long Term: Adherence to nutrition and physical activity/exercise program aimed toward attainment of established weight goal;Weight Loss: Understanding of general recommendations for a balanced deficit meal plan, which promotes 1-2 lb weight loss per week and includes a negative energy balance of [phone] kcal/d;Understanding recommendations for meals to include 15-35% energy as protein, 25-35% energy from fat, 35-60% energy from carbohydrates, less than '200mg'$  of dietary cholesterol, 20-35 gm of total fiber daily;Understanding of distribution of calorie intake throughout the day with the consumption of 4-5 meals/snacks    Heart Failure  Yes    Intervention  Provide a combined exercise  and nutrition program that is supplemented with education, support and counseling about heart failure. Directed toward relieving symptoms such as shortness of breath, decreased exercise tolerance, and extremity edema.    Expected Outcomes  Improve functional capacity of life;Short term: Attendance in program 2-3 days a week with increased exercise capacity. Reported lower sodium intake. Reported increased fruit and vegetable intake. Reports medication compliance.;Short term: Daily weights obtained and reported for increase. Utilizing diuretic protocols set by physician.;Long term: Adoption of self-care skills and reduction of barriers for early signs and symptoms recognition and intervention  leading to self-care maintenance.    Lipids  Yes    Intervention  Provide education and support for participant on nutrition & aerobic/resistive exercise along with prescribed medications to achieve LDL '70mg'$ , HDL >'40mg'$ .    Expected Outcomes  Short Term: Participant states understanding of desired cholesterol values and is compliant with medications prescribed. Participant is following exercise prescription and nutrition guidelines.;Long Term: Cholesterol controlled with medications as prescribed, with individualized exercise RX and with personalized nutrition plan. Value goals: LDL < '70mg'$ , HDL > 40 mg.       Core Components/Risk Factors/Patient Goals Review:  Goals and Risk Factor Review    Row Name 03/13/19 0748 04/03/19 0744 04/29/19 0815 05/27/19 0746       Core Components/Risk Factors/Patient Goals Review   Personal Goals Review  Weight Management/Obesity;Hypertension;Heart Failure;Lipids  Weight Management/Obesity;Hypertension;Heart Failure;Lipids  Weight Management/Obesity;Improve shortness of breath with ADL's  Weight Management/Obesity;Improve shortness of breath with ADL's;Hypertension    Review  Penny is doing well with his weight. He checks it in class as he doesn't check it at home as he doesn't have a  scale.  He will let us know about the scale.  Reviewed heart failure action plan with him today.  He is watching his sodium levels.  His pressures have been good in class as he does not have a cuff.  He will look into it.  We can into foundation for providing these to him.  He is doing well with his medicaitons.  Damyn is staying steady with his weight around 202 lbs.  He still has not gotten a scale or cuff so he only checks them here.  His pressures have been good in class.  He has not had any heart failure symptoms.  We will look into foundation for help with scale and cuff to heart failure management.  Guerino wants to lose some more weight and be around 190 pounds. He has been eating healthy but eating to much. Informed him to drink some water before a meal and wait after heats his plate to let his food settle. He is going to try to lose 5 pounds in the next two weeks.  Baran is doing well.  His weight is staying around 200-205 lbs.  He continues to eat better and drinks lots of water.  He continues to try to lose weight. His breathing is getting better and now its just the hard work that gets him.  His pressures have been good in class (he does not check at home).  He had a follow up last week and the doctor was pleased with his progress.    Expected Outcomes  Short: Look into getting scale and cuff for home use.  Long: Continue to monitor risk factors.  Short: talk to foundation.  Long: Continue to montior heart failure.  Short: lose 5 pounds in two weeks. Long: maintain weight loss independently.  Short: Continue to work on weight loss.  Long: Continue to monitor risk factors.       Core Components/Risk Factors/Patient Goals at Discharge (Final Review):  Goals and Risk Factor Review - 05/27/19 0746      Core Components/Risk Factors/Patient Goals Review   Personal Goals Review  Weight Management/Obesity;Improve shortness of breath with ADL's;Hypertension    Review  Nicholaos is doing well.  His weight is  staying around 200-205 lbs.  He continues to eat better and drinks lots of water.  He continues to try to lose weight. His breathing is getting better and  now its just the hard work that gets him.  His pressures have been good in class (he does not check at home).  He had a follow up last week and the doctor was pleased with his progress.    Expected Outcomes  Short: Continue to work on weight loss.  Long: Continue to monitor risk factors.       ITP Comments: ITP Comments    Row Name 02/26/19 1043 02/27/19 1052 03/26/19 1311 04/23/19 0634 05/21/19 1012   ITP Comments  Virtual initial orientation completed. Diagnosis can be found in CE 8/26. EP/RD orientation scheduled for 9/10 at 9:30  Completed 6MWT, gym orientation, and RD evaluation. Initial ITP created and sent for review to Dr. Emily Filbert, Medical Director.  30 day review completed. ITP sent to Dr. Emily Filbert, Medical Director of Cardiac and Pulmonary Rehab. Continue with ITP unless changes are made by physician.  Department closed starting 10/2 until further notice by infection prevention and Health at Work teams for COVID-19.  30 day review completed. Continue with ITP sent to Dr. Emily Filbert, Medical Director of Cardiac and Pulmonary Rehab for review , changes as needed and signature.  30 day review competed . ITP sent to Dr Emily Filbert for review, changes as needed and ITP approval signature.   [place] Name 06/18/19 0850 06/27/19 1022 07/03/19 0854 07/16/19 1125 07/16/19 1126   ITP Comments  30 day review competed . ITP sent to Dr Emily Filbert for review, changes as needed and ITP approval signature  Due to the increase in COVID-19 numbers, Larnie would like to graduate at his next visit.  Virtual call completed today. Calls are done every week that patient is not attending an onsite exercise session during the COVID 19 PHE Crisis.   Today's call included review of :their home exercise    Lovelle is using his new [address] [employer] [address]  Daily ,except  workdays,with 15 min of weights and stretching while he is not in class.   No voiced concerns today.  Orva called out sick on last week when he supposed to graduate.  Left message that we can still finish up this next week instead.  Scheduled for follow up call tomorrow.  30 day review completed. ITP sent to Dr. Emily Filbert, Medical Director of Cardiac and Pulmonary Rehab. Continue with ITP unless changes are made by physician.  Department operating under reduced schedule until further notice by request from hospital leadership.   [place] Name 07/17/19 0815 08/13/19 0647 08/21/19 1623       ITP Comments  Virtual call completed today.  30 day chart review completed. ITP sent to Dr Zachery Dakins Medical Director, for review,changes as needed and signature.  Arnel asked to discharge due to [place].  Graduation stuff was sent.  He will be discharged at this time.        Comments: Discharge ITP

## 2021-03-25 ENCOUNTER — Emergency Department: Payer: PRIVATE HEALTH INSURANCE | Attending: Physician Assistant

## 2021-03-25 ENCOUNTER — Emergency Department
Admission: EM | Admit: 2021-03-25 | Discharge: 2021-03-25 | Disposition: A | Discharge status: Home / Self Care | Payer: No Typology Code available for payment source | Attending: Emergency Medicine | Admitting: Emergency Medicine

## 2021-03-25 ENCOUNTER — Encounter: Payer: Self-pay | Admitting: Emergency Medicine

## 2021-03-25 ENCOUNTER — Other Ambulatory Visit: Payer: Self-pay

## 2021-03-25 DIAGNOSIS — W230XXA Caught, crushed, jammed, or pinched between moving objects, initial encounter: Secondary | ICD-10-CM | POA: Insufficient documentation

## 2021-03-25 DIAGNOSIS — Z7982 Long term (current) use of aspirin: Secondary | ICD-10-CM | POA: Diagnosis not present

## 2021-03-25 DIAGNOSIS — Z23 Encounter for immunization: Secondary | ICD-10-CM | POA: Insufficient documentation

## 2021-03-25 DIAGNOSIS — Y990 Civilian activity done for income or pay: Secondary | ICD-10-CM | POA: Diagnosis not present

## 2021-03-25 DIAGNOSIS — S61212A Laceration without foreign body of right middle finger without damage to nail, initial encounter: Secondary | ICD-10-CM | POA: Insufficient documentation

## 2021-03-25 DIAGNOSIS — S6710XA Crushing injury of unspecified finger(s), initial encounter: Secondary | ICD-10-CM

## 2021-03-25 DIAGNOSIS — I5020 Unspecified systolic (congestive) heart failure: Secondary | ICD-10-CM | POA: Diagnosis not present

## 2021-03-25 DIAGNOSIS — S6991XA Unspecified injury of right wrist, hand and finger(s), initial encounter: Secondary | ICD-10-CM | POA: Diagnosis present

## 2021-03-25 HISTORY — DX: Heart failure, unspecified: I50.9

## 2021-03-25 MED ORDER — OXYCODONE-ACETAMINOPHEN 7.5-325 MG PO TABS: 1.0000 | ORAL_TABLET | Freq: Four times a day (QID) | ORAL | 0 refills | Status: AC | PRN

## 2021-03-25 MED ORDER — LIDOCAINE HCL (PF) 1 % IJ SOLN
5.0000 mL | Freq: Once | INTRAMUSCULAR | Status: AC
  Administered 2021-03-25: 5 mL
  Filled 2021-03-25: qty 5

## 2021-03-25 MED ORDER — TETANUS-DIPHTH-ACELL PERTUSSIS 5-2.5-18.5 LF-MCG/0.5 IM SUSY
0.5000 mL | PREFILLED_SYRINGE | Freq: Once | INTRAMUSCULAR | Status: AC
  Administered 2021-03-25: 0.5 mL via INTRAMUSCULAR
  Filled 2021-03-25: qty 0.5

## 2021-03-25 MED ORDER — BACITRACIN-NEOMYCIN-POLYMYXIN 400-5-5000 EX OINT
TOPICAL_OINTMENT | Freq: Once | CUTANEOUS | Status: AC
  Filled 2021-03-25: qty 1

## 2021-03-25 MED ORDER — SULFAMETHOXAZOLE-TRIMETHOPRIM 800-160 MG PO TABS: 1.0000 | ORAL_TABLET | Freq: Two times a day (BID) | ORAL | 0 refills | Status: AC

## 2021-03-25 NOTE — ED Provider Notes (Signed)
[facility] Emergency Department Provider Note   ____________________________________________   Event Date/Time   First MD Initiated Contact with Patient 03/25/21 1141     (approximate)  I have reviewed the triage vital signs and the nursing notes.   HISTORY  Chief Complaint Finger Injury (Workers Comp)    HPI Brandon Kennedy is a 60 y.o. male patient presents with right middle finger laceration and contusion.  Patient states finger was crushed against a machine by around steel bar.  Patient denies loss sensation or loss of function.  Bleeding is controlled direct pressure.  Denies pain at this time.  Tetanus status not up-to-date.  Patient has pacemaker.         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 Authorizing Provider  oxyCODONE-acetaminophen (PERCOCET) 7.5-325 MG tablet Take 1 tablet by mouth every 6 (six) hours as needed for severe pain. 03/25/21  Yes Joni Reining, PA-C  sulfamethoxazole-trimethoprim (BACTRIM DS) 800-160 MG tablet Take 1 tablet by mouth 2 (two) times daily. 03/25/21  Yes Joni Reining, PA-C  aspirin EC 81 MG tablet Take by mouth.    [provider]  losartan (COZAAR) 25 MG tablet Take by mouth. 01/08/19   [provider]  metoprolol succinate (TOPROL-XL) 25 MG 24 hr tablet Take by  mouth. 02/12/19 02/12/20  [provider]    Allergies Patient has no known allergies.  No family history on file.  Social History Social History   Tobacco Use   Smoking status: Never   Smokeless tobacco: Never    Review of Systems Constitutional: No fever/chills Eyes: No visual changes. ENT: No sore throat. Cardiovascular: Denies chest pain.  Congestive heart failure Respiratory: Denies shortness of breath. Gastrointestinal: No abdominal pain.  No nausea, no vomiting.  No diarrhea.  No constipation. Genitourinary: Negative for dysuria. Musculoskeletal: Negative for back pain. Skin: Negative for rash.  Laceration to the third digit right hand. Neurological: Negative for headaches, focal weakness or numbness. Endocrine: Hyperlipidemia and hypertension   ____________________________________________   PHYSICAL EXAM:  VITAL SIGNS: ED Triage Vitals  Enc Vitals Group     BP 03/25/21 1129 118/67     Pulse Rate 03/25/21 1129 (!) 55     Resp 03/25/21 1129 18     Temp 03/25/21 1129 98.2 F (36.8 C)     Temp Source 03/25/21 1129 Oral     SpO2 03/25/21 1129 97 %     Weight 03/25/21 1130 200 lb (90.7 kg)     Height 03/25/21 1130 5\' 11"  (1.803 m)     Head Circumference --      Peak Flow --      Pain Score 03/25/21 1129 0     Pain Loc --      Pain Edu? --      Excl. in GC? --  Constitutional: Alert and oriented. Well appearing and in no acute distress. Hematological/Lymphatic/Immunilogical: No cervical lymphadenopathy. Cardiovascular: Bradycardic, regular rhythm. Grossly normal heart sounds.  Good peripheral circulation. Respiratory: Normal respiratory effort.  No retractions. Lungs CTAB. Musculoskeletal: No lower extremity tenderness nor edema.  No joint effusions. Neurologic:  Normal speech and language. No gross focal neurologic deficits are appreciated. No gait instability. Skin:  Skin is warm, dry and intact. No rash noted.  Laceration to the palmar aspect  of the third digit right hand. Psychiatric: Mood and affect are normal. Speech and behavior are normal.  ____________________________________________   LABS (all labs ordered are listed, but only abnormal results are displayed)  Labs Reviewed - No data to display ____________________________________________  EKG   ____________________________________________  RADIOLOGY I, Joni Reining, personally viewed and evaluated these images (plain radiographs) as part of my medical decision making, as well as reviewing the written report by the radiologist.  ED MD interpretation: No acute findings x-ray to third digit right hand.  Official radiology report(s): DG Finger Middle Right  Result Date: 03/25/2021 CLINICAL DATA:  Injury to RIGHT middle finger, caught against a metal bar, contusion, laceration EXAM: RIGHT MIDDLE FINGER 2+V COMPARISON:  None FINDINGS: Soft tissue swelling and laceration RIGHT middle finger. Osseous mineralization normal. Joint spaces preserved. No acute fracture, dislocation, or bone destruction. IMPRESSION: No acute abnormalities. Electronically Signed   By: Ulyses Southward M.D.   On: 03/25/2021 12:38    ____________________________________________   PROCEDURES  Procedure(s) performed (including Critical Care):  Marland KitchenMarland KitchenLaceration Repair  Date/Time: 03/25/2021 2:51 PM Performed by: Joni Reining, PA-C Authorized by: Joni Reining, PA-C   Consent:    Consent obtained:  Verbal   Consent given by:  Patient   Risks discussed:  Infection, pain, poor cosmetic result, need for additional repair and poor wound healing Universal protocol:    Procedure explained and questions answered to patient or proxy's satisfaction: yes     Relevant documents present and verified: yes     Imaging studies available: yes     Immediately prior to procedure, a time out was called: yes     Patient identity confirmed:  Verbally with patient Anesthesia:    Anesthesia method:  Nerve  block   Block needle gauge:  25 G   Block anesthetic:  Lidocaine 1% w/o epi   Block injection procedure:  Anatomic landmarks identified and incremental injection   Block outcome:  Anesthesia achieved Laceration details:    Location:  Finger   Finger location:  R long finger   Length (cm):  3   Depth (mm):  2 Pre-procedure details:    Preparation:  Patient was prepped and draped in usual sterile fashion and imaging obtained to evaluate for foreign bodies Exploration:    Hemostasis achieved with:  Direct pressure   Imaging outcome: foreign body not noted     Contaminated: yes   Treatment:    Area cleansed with:  Povidone-iodine and saline   Amount of cleaning:  Standard   Irrigation solution:  Sterile saline   Irrigation method:  Syringe   Debridement:  None   Undermining:  None   Scar revision: no   Skin repair:    Repair method:  Sutures   Suture size:  4-0   Suture material:  Nylon   Suture technique:  Simple interrupted   Number of sutures:  11 Approximation:    Approximation:  Close Repair type:    Repair type:  Simple Post-procedure details:  Dressing:  Antibiotic ointment, sterile dressing and splint for protection   Procedure completion:  Tolerated well, no immediate complications   ____________________________________________   INITIAL IMPRESSION / ASSESSMENT AND PLAN / ED COURSE  As part of my medical decision making, I reviewed the following data within the electronic MEDICAL RECORD NUMBER         Patient presents with laceration and contusion to the third digit left hand.  See procedure note for wound closure.  Patient given discharge care instructions and advised return back in 10 days for suture removal.      ____________________________________________   FINAL CLINICAL IMPRESSION(S) / ED DIAGNOSES  Final diagnoses:  Laceration of right middle finger w/o foreign body w/o damage to nail, initial encounter  Crushing injury of finger, initial  encounter     ED Discharge Orders          Ordered    oxyCODONE-acetaminophen (PERCOCET) 7.5-325 MG tablet  Every 6 hours PRN        03/25/21 1303    sulfamethoxazole-trimethoprim (BACTRIM DS) 800-160 MG tablet  2 times daily        03/25/21 1303             Note:  This document was prepared using Dragon voice recognition software and may include unintentional dictation errors.    Joni Reining, PA-C 03/25/21 1505    Arnaldo Natal, MD 03/25/21 1623

## 2021-03-25 NOTE — ED Triage Notes (Signed)
First Nurse NOte:  Right middle finger crush injury

## 2021-03-25 NOTE — ED Triage Notes (Signed)
Pt states that he was at work today and his right middle finger was crush against a machine by a round steel bar. Pts finger is wrapped at this time. No blood noted on outside of dressing. Pt is in NAD.

## 2021-03-25 NOTE — ED Notes (Signed)
See triage note presents with injury to right middle finger   states he got is caught against a metal bar

## 2021-03-25 NOTE — Discharge Instructions (Addendum)
Wear splint for 3 to 5 days as needed.  Read and follow discharge care instruction.  Take medication as directed.  Have sutures removed in 10 days.

## 2021-04-04 ENCOUNTER — Emergency Department: Payer: No Typology Code available for payment source

## 2021-04-04 ENCOUNTER — Emergency Department
Admission: EM | Admit: 2021-04-04 | Discharge: 2021-04-04 | Disposition: A | Discharge status: Home / Self Care | Payer: No Typology Code available for payment source | Attending: Student in an Organized Health Care Education/Training Program | Admitting: Student in an Organized Health Care Education/Training Program

## 2021-04-04 ENCOUNTER — Other Ambulatory Visit: Payer: Self-pay

## 2021-04-04 DIAGNOSIS — I10 Essential (primary) hypertension: Secondary | ICD-10-CM | POA: Diagnosis not present

## 2021-04-04 DIAGNOSIS — S62602D Fracture of unspecified phalanx of right middle finger, subsequent encounter for fracture with routine healing: Secondary | ICD-10-CM

## 2021-04-04 DIAGNOSIS — Z7982 Long term (current) use of aspirin: Secondary | ICD-10-CM | POA: Insufficient documentation

## 2021-04-04 DIAGNOSIS — Z4802 Encounter for removal of sutures: Secondary | ICD-10-CM

## 2021-04-04 DIAGNOSIS — S62622D Displaced fracture of medial phalanx of right middle finger, subsequent encounter for fracture with routine healing: Secondary | ICD-10-CM | POA: Diagnosis not present

## 2021-04-04 DIAGNOSIS — S6991XA Unspecified injury of right wrist, hand and finger(s), initial encounter: Secondary | ICD-10-CM | POA: Diagnosis present

## 2021-04-04 DIAGNOSIS — X58XXXA Exposure to other specified factors, initial encounter: Secondary | ICD-10-CM | POA: Insufficient documentation

## 2021-04-04 DIAGNOSIS — Z79899 Other long term (current) drug therapy: Secondary | ICD-10-CM | POA: Insufficient documentation

## 2021-04-04 NOTE — Discharge Instructions (Signed)
With orthopedics.  Please call for an appointment.  Ask for the hand surgeon.  Make sure that your Worker's Comp. insurance does approve this facility.  Otherwise she will have to pay the bill.  If they do not approve them for their Worker's Comp. insurance, please have your Worker's Comp. rep refer you to a hand specialist.

## 2021-04-04 NOTE — ED Provider Notes (Signed)
[facility] Emergency Department Provider Note  ____________________________________________   Event Date/Time   First MD Initiated Contact with Patient 04/04/21 0912     (approximate)  I have reviewed the triage vital signs and the nursing notes.   HISTORY  Chief Complaint Suture / Staple Removal    HPI Brandon Kennedy is a 60 y.o. male here for suture removal of the right middle finger.  Patient states its been healing and he has kept it covered.  States he still cannot bend the finger like normal.  Has some swelling and pain along the sides.  No fever or chills.  No drainage from the site.  Patient is right-handed.  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 Authorizing Provider  aspirin EC 81 MG tablet Take by mouth.    [provider]  losartan (COZAAR) 25 MG tablet Take by mouth. 01/08/19   [provider]  metoprolol succinate (TOPROL-XL) 25 MG 24 hr tablet Take by mouth. 02/12/19 02/12/20  [provider]  oxyCODONE-acetaminophen (PERCOCET) 7.5-325 MG tablet Take 1 tablet by mouth every 6 (six) hours as needed for severe pain. 03/25/21   Joni Reining, PA-C  sulfamethoxazole-trimethoprim (BACTRIM DS) 800-160 MG tablet Take 1 tablet by mouth 2 (two) times daily. 03/25/21    Joni Reining, PA-C    Allergies Patient has no known allergies.  No family history on file.  Social History Social History   Tobacco Use   Smoking status: Never   Smokeless tobacco: Never    Review of Systems  Constitutional: No fever/chills Eyes: No visual changes. ENT: No sore throat. Respiratory: Denies cough Genitourinary: Negative for dysuria. Musculoskeletal: Negative for back pain. Skin: Negative for rash. Psychiatric: no mood changes,     ____________________________________________   PHYSICAL EXAM:  VITAL SIGNS: ED Triage Vitals  Enc Vitals Group     BP 04/04/21 0851 103/63     Pulse Rate 04/04/21 0851 72     Resp 04/04/21 0851 18     Temp 04/04/21 0851 98.3 F (36.8 C)     Temp Source 04/04/21 0851 Oral     SpO2 04/04/21 0851 96 %     Weight 04/04/21 0916 199 lb 15.3 oz (90.7 kg)     Height 04/04/21 0916 5\' 11"  (1.803 m)     Head Circumference --      Peak Flow --      Pain Score 04/04/21 0848 0     Pain Loc --      Pain Edu? --      Excl. in GC? --     Constitutional: Alert and oriented. Well appearing and in no acute distress. Eyes: Conjunctivae are normal.  Head: Atraumatic. Nose: No congestion/rhinnorhea. Mouth/Throat: Mucous membranes are moist.   Neck:  supple no lymphadenopathy noted  Cardiovascular: Normal rate, regular rhythm.  Respiratory: Normal respiratory effort.  No retractions,  GU: deferred Musculoskeletal: Decreased range of motion of the right middle finger with flexion, laceration on the volar surface appears to be healing, areas are tender to palpation, some swelling is noted neurologic:  Normal speech and language.  Skin:  Skin is warm, dry and intact. No rash noted. Psychiatric: Mood and affect are normal. Speech and behavior are normal.  ____________________________________________   LABS (all labs ordered are listed, but only abnormal results are displayed)  Labs Reviewed - No data to  display ____________________________________________   ____________________________________________  RADIOLOGY  X-ray right middle finger  ____________________________________________   PROCEDURES  Procedure(s) performed: Finger splint applied by nursing staff   Procedures    ____________________________________________   INITIAL IMPRESSION / ASSESSMENT AND PLAN / ED COURSE  Pertinent labs & imaging results that were available during my care of the patient were reviewed by me and considered in my medical decision making (see chart for details).   The patient is a 60 year old male presents with right middle finger pain.  See HPI.  Physical exam shows patient appears stable.  Concerns for fracture versus tendon laceration.  X-ray of the right middle finger does show a bone flake on the dorsum which could indicate a fracture.  Due to the patient not having full range of motion of the finger we will place him in a finger splint and have him follow-up with orthopedics.  Feel he needs to see hand specialist.  Patient is in agreement treatment plan.  Worker's Comp. note sent to his employer via the patient.  He has no use of the right hand until released by orthopedics.  Keep the areas clean and dry as possible.  Return emergency department for worsening    [name] was evaluated in Emergency Department on 04/04/2021 for the symptoms described in the history of present illness. He was evaluated in the context of the global COVID-19 pandemic, which necessitated consideration that the patient might be at risk for infection with the SARS-CoV-2 virus that causes COVID-19. Institutional protocols and algorithms that pertain to the evaluation of patients at risk for COVID-19 are in a state of rapid change based on information released by regulatory bodies including the CDC and federal and state organizations. These policies and algorithms were followed during the patient's care in the  ED.    As part of my medical decision making, I reviewed the following data within the electronic MEDICAL RECORD NUMBER Nursing notes reviewed and incorporated, Old chart reviewed, Radiograph reviewed , Notes from prior ED visits, and [place] Controlled Substance Database  ____________________________________________   FINAL CLINICAL IMPRESSION(S) / ED DIAGNOSES  Final diagnoses:  Visit for suture removal  Fracture of unspecified phalanx of right middle finger, subsequent encounter for fracture with routine healing      NEW MEDICATIONS STARTED DURING THIS VISIT:  New Prescriptions   No medications on file     Note:  This document was prepared using Dragon voice recognition software and may include unintentional dictation errors.    Faythe Ghee, PA-C 04/04/21 1014    Willy Eddy, MD 04/04/21 [phone]

## 2021-04-04 NOTE — ED Triage Notes (Signed)
Pt comes with needing stiches removed. Pt also wants finger looked at to make sure it is healing.

## 2022-10-11 IMAGING — DX DG FINGER MIDDLE 2+V*R*
3 series · 3 of 3 positions shown · non-contrast
Comparison: None

CLINICAL DATA: Injury to RIGHT middle finger, caught against a
metal bar, contusion, laceration

EXAM:
RIGHT MIDDLE FINGER 2+V

[finger ap]
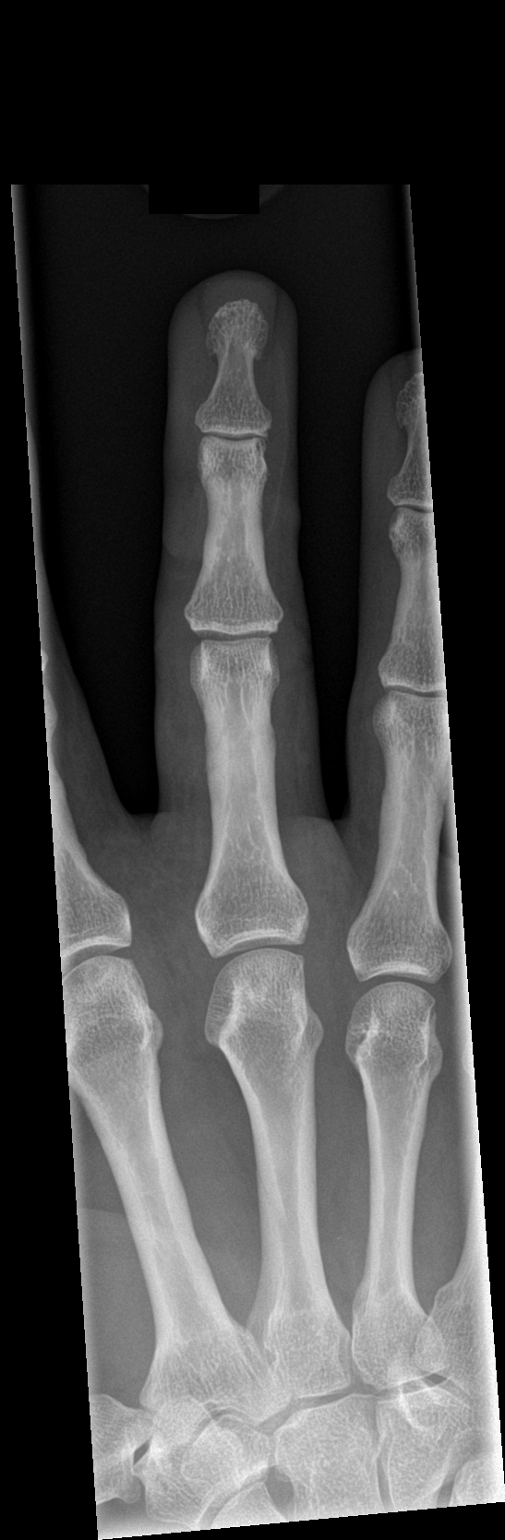

[finger obl]
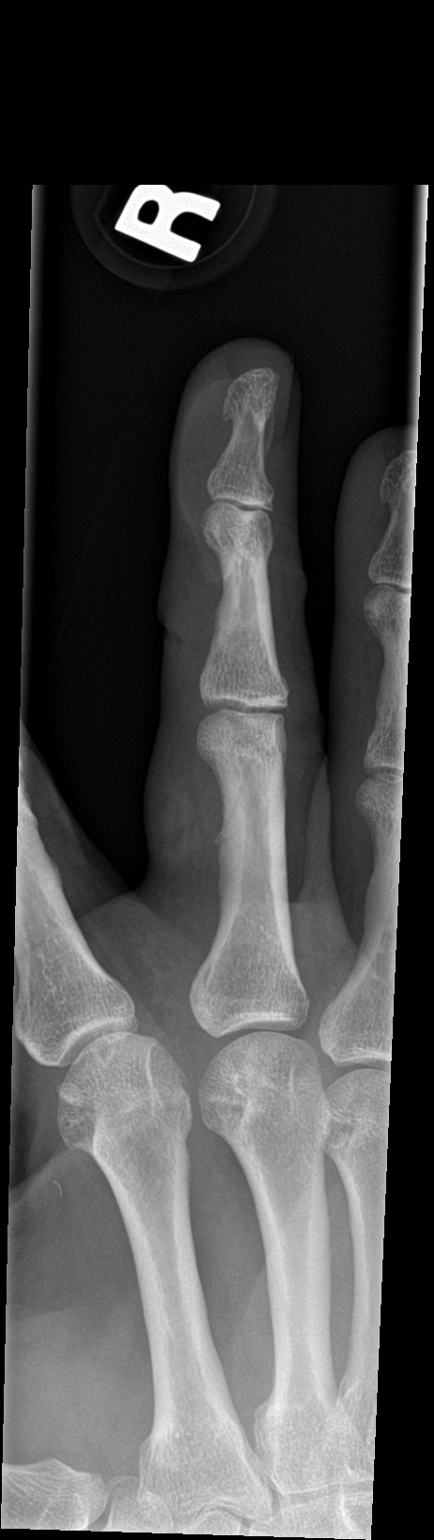

[finger lat]
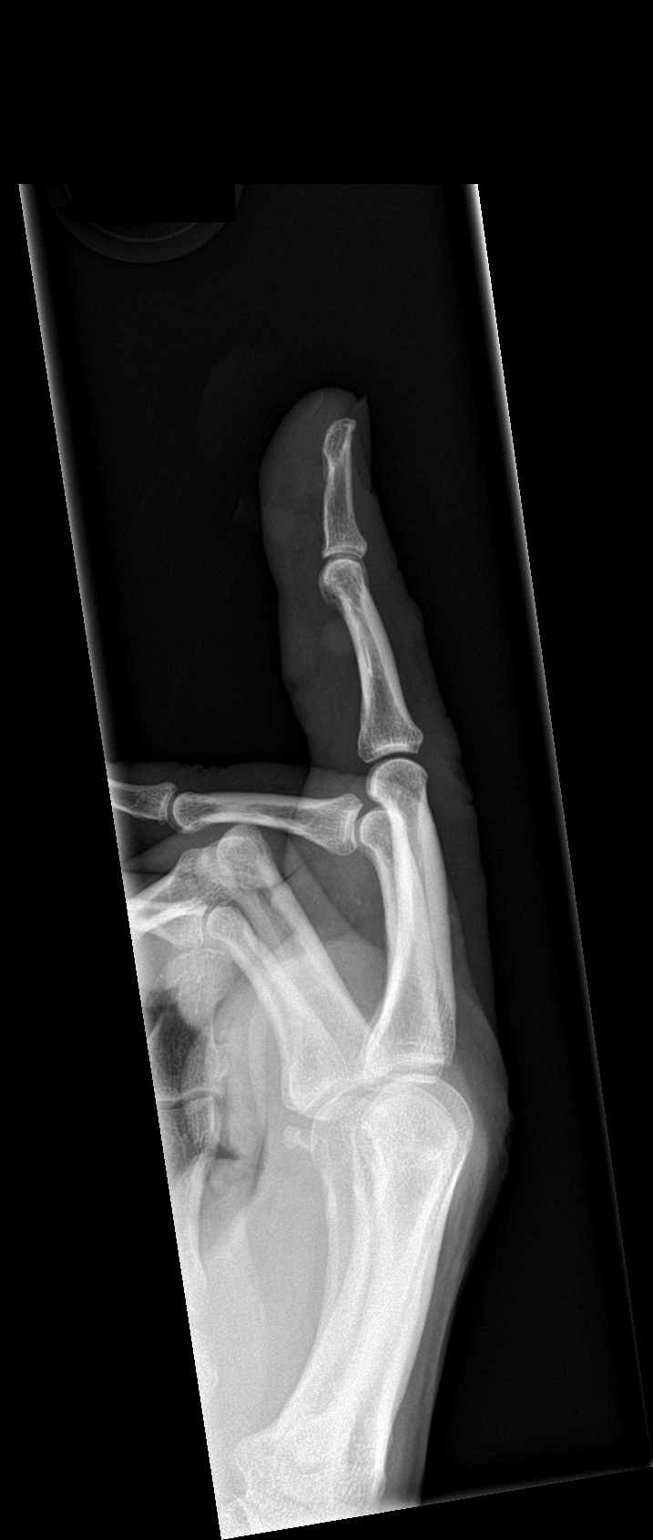

[3 of 3 positions shown; findings below may reference images not displayed]

FINDINGS: Soft tissue swelling and laceration RIGHT middle finger.

Osseous mineralization normal.

Joint spaces preserved.

No acute fracture, dislocation, or bone destruction.
IMPRESSION: No acute abnormalities.

## 2022-10-21 IMAGING — DX DG FINGER MIDDLE 2+V*R*
3 series · 3 of 3 positions shown · non-contrast
Comparison: Ten days ago

CLINICAL DATA: Middle finger injury 10 days ago.

EXAM:
RIGHT MIDDLE FINGER 3V

[finger ap]
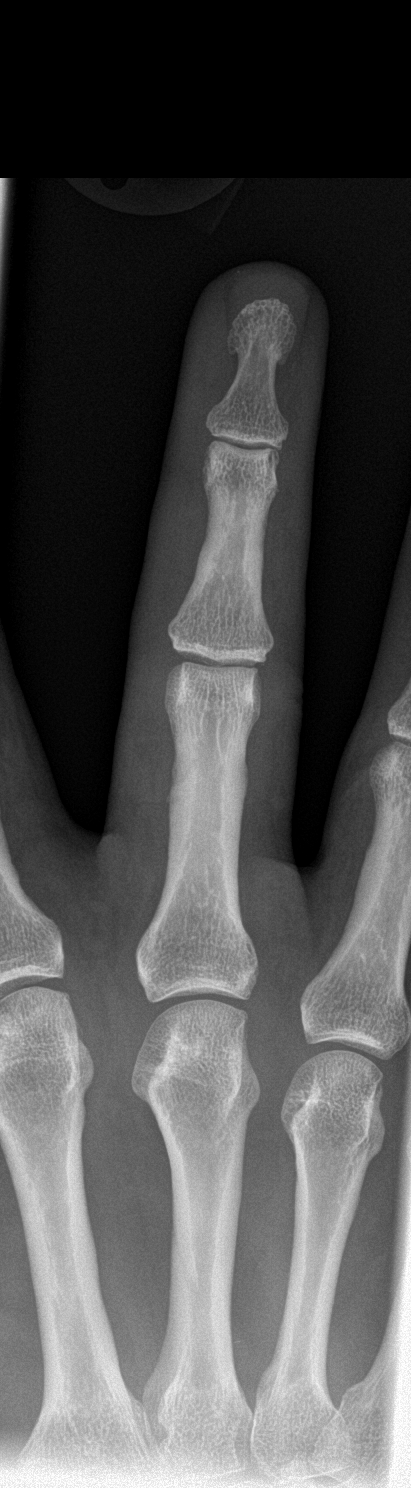

[finger obl]
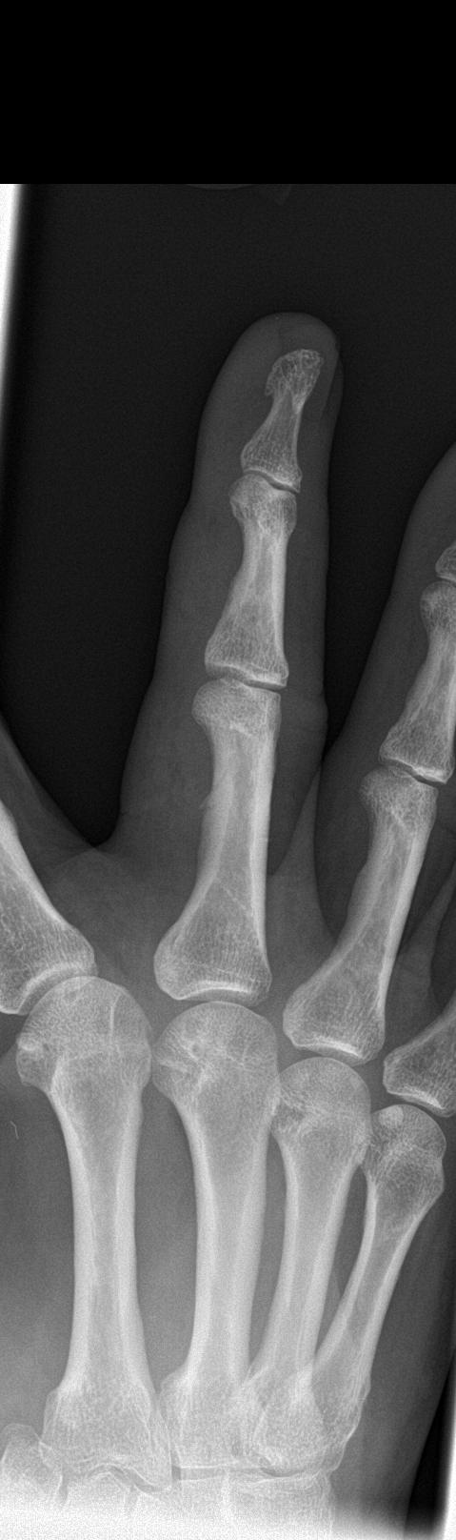

[finger lat]
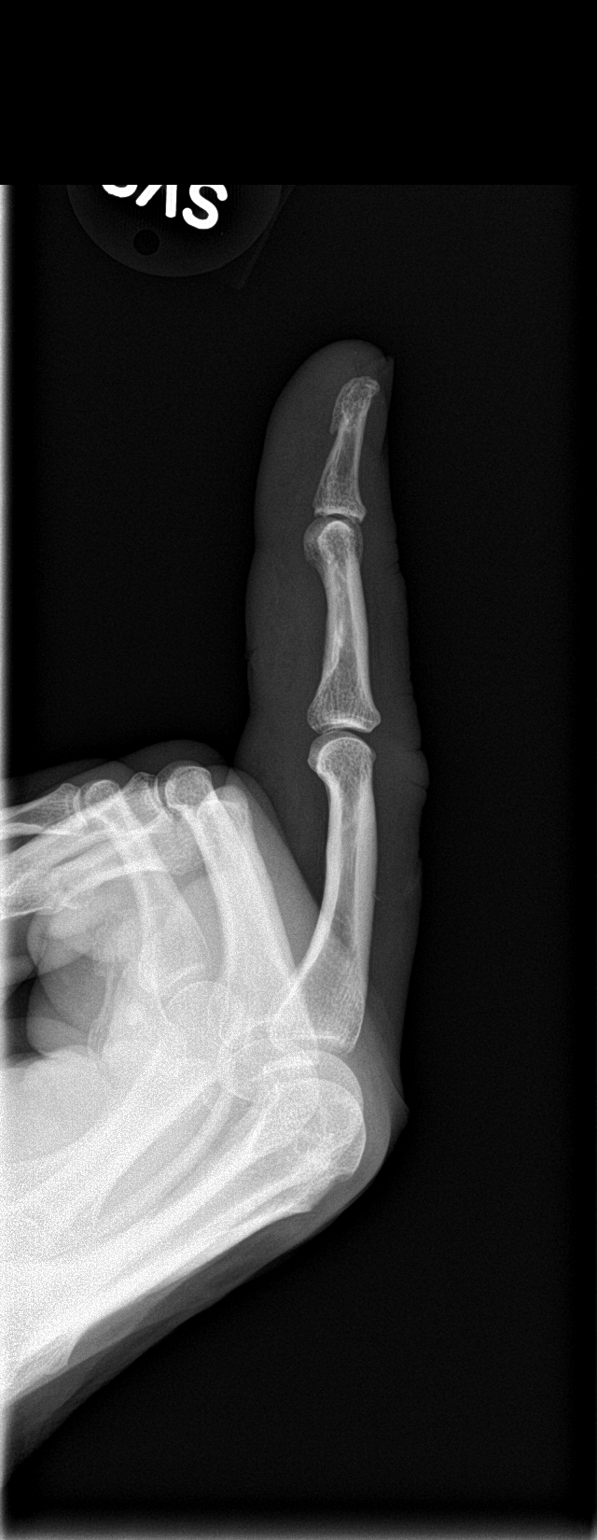

[3 of 3 positions shown; findings below may reference images not displayed]

FINDINGS: Tiny flake of bone along the dorsal shaft of the proximal phalanx
which could be from direct impact. No soft tissue gas or
malalignment
IMPRESSION: Tiny flake of bone along the dorsal shaft of the proximal phalanx
which could be from direct impact.
